# Patient Record
Sex: Female | Born: 1980
Health system: Southern US, Community
[De-identification: ages and names within clinical notes are randomized; demographics above are authoritative.]

## PROBLEM LIST (undated history)

## (undated) DIAGNOSIS — E785 Hyperlipidemia, unspecified: Secondary | ICD-10-CM

## (undated) DIAGNOSIS — R0602 Shortness of breath: Secondary | ICD-10-CM

## (undated) DIAGNOSIS — J069 Acute upper respiratory infection, unspecified: Secondary | ICD-10-CM

## (undated) HISTORY — PX: TONSILLECTOMY: SUR1361

## (undated) HISTORY — PX: ELBOW SURGERY: SHX618

---

## 2004-03-27 ENCOUNTER — Emergency Department (HOSPITAL_COMMUNITY): Admission: EM | Admit: 2004-03-27 | Discharge: 2004-03-27 | Payer: Self-pay | Admitting: *Deleted

## 2004-04-06 ENCOUNTER — Ambulatory Visit: Payer: Self-pay | Admitting: Internal Medicine

## 2004-06-23 ENCOUNTER — Ambulatory Visit: Payer: Self-pay | Admitting: Internal Medicine

## 2004-07-29 ENCOUNTER — Ambulatory Visit: Payer: Self-pay | Admitting: Internal Medicine

## 2006-01-26 ENCOUNTER — Emergency Department (HOSPITAL_COMMUNITY): Admission: EM | Admit: 2006-01-26 | Discharge: 2006-01-26 | Payer: Self-pay | Admitting: Emergency Medicine

## 2006-03-16 ENCOUNTER — Ambulatory Visit: Payer: Self-pay | Admitting: Family Medicine

## 2006-08-17 ENCOUNTER — Inpatient Hospital Stay (HOSPITAL_COMMUNITY): Admission: AD | Admit: 2006-08-17 | Discharge: 2006-08-22 | Payer: Self-pay | Admitting: Obstetrics and Gynecology

## 2006-08-17 ENCOUNTER — Ambulatory Visit: Payer: Self-pay | Admitting: Obstetrics & Gynecology

## 2006-08-20 ENCOUNTER — Encounter (INDEPENDENT_AMBULATORY_CARE_PROVIDER_SITE_OTHER): Payer: Self-pay | Admitting: Specialist

## 2006-08-23 ENCOUNTER — Ambulatory Visit: Payer: Self-pay | Admitting: Obstetrics and Gynecology

## 2006-08-23 ENCOUNTER — Inpatient Hospital Stay (HOSPITAL_COMMUNITY): Admission: AD | Admit: 2006-08-23 | Discharge: 2006-08-23 | Payer: Self-pay | Admitting: Obstetrics & Gynecology

## 2008-10-23 ENCOUNTER — Emergency Department (HOSPITAL_COMMUNITY): Admission: EM | Admit: 2008-10-23 | Discharge: 2008-10-23 | Payer: Self-pay | Admitting: Emergency Medicine

## 2008-10-29 ENCOUNTER — Ambulatory Visit: Payer: Self-pay | Admitting: Nurse Practitioner

## 2008-10-29 DIAGNOSIS — H8309 Labyrinthitis, unspecified ear: Secondary | ICD-10-CM | POA: Insufficient documentation

## 2008-10-29 LAB — CONVERTED CEMR LAB
BUN: 11 mg/dL (ref 6–23)
Basophils Relative: 1 % (ref 0–1)
CO2: 28 meq/L (ref 19–32)
Calcium: 9.4 mg/dL (ref 8.4–10.5)
Eosinophils Absolute: 0.3 10*3/uL (ref 0.0–0.7)
HCT: 42.4 % (ref 36.0–46.0)
Hemoglobin: 14 g/dL (ref 12.0–15.0)
Lymphs Abs: 2.8 10*3/uL (ref 0.7–4.0)
MCHC: 33 g/dL (ref 30.0–36.0)
Monocytes Relative: 8 % (ref 3–12)
Platelets: 319 10*3/uL (ref 150–400)
RBC: 4.44 M/uL (ref 3.87–5.11)

## 2008-10-30 ENCOUNTER — Encounter (INDEPENDENT_AMBULATORY_CARE_PROVIDER_SITE_OTHER): Payer: Self-pay | Admitting: Nurse Practitioner

## 2008-11-04 ENCOUNTER — Ambulatory Visit: Payer: Self-pay | Admitting: Nurse Practitioner

## 2008-11-05 LAB — CONVERTED CEMR LAB
HDL: 33 mg/dL — ABNORMAL LOW (ref >39)
Total CHOL/HDL Ratio: 5.4

## 2008-11-11 ENCOUNTER — Ambulatory Visit: Payer: Self-pay | Admitting: *Deleted

## 2008-11-20 ENCOUNTER — Ambulatory Visit: Payer: Self-pay | Admitting: Nurse Practitioner

## 2008-11-20 DIAGNOSIS — E7800 Pure hypercholesterolemia, unspecified: Secondary | ICD-10-CM

## 2008-11-20 LAB — CONVERTED CEMR LAB
Cholesterol, target level: 200 mg/dL
HDL goal, serum: 40 mg/dL

## 2009-01-01 ENCOUNTER — Encounter (INDEPENDENT_AMBULATORY_CARE_PROVIDER_SITE_OTHER): Payer: Self-pay | Admitting: Internal Medicine

## 2009-01-01 ENCOUNTER — Ambulatory Visit: Payer: Self-pay | Admitting: Nurse Practitioner

## 2009-01-01 LAB — CONVERTED CEMR LAB
Triglycerides: 132 mg/dL (ref <150)
VLDL: 26 mg/dL (ref 0–40)

## 2009-01-06 ENCOUNTER — Telehealth (INDEPENDENT_AMBULATORY_CARE_PROVIDER_SITE_OTHER): Payer: Self-pay | Admitting: Nurse Practitioner

## 2009-02-03 ENCOUNTER — Ambulatory Visit: Payer: Self-pay | Admitting: Family Medicine

## 2009-02-03 DIAGNOSIS — G44229 Chronic tension-type headache, not intractable: Secondary | ICD-10-CM | POA: Insufficient documentation

## 2009-02-03 DIAGNOSIS — G4700 Insomnia, unspecified: Secondary | ICD-10-CM | POA: Insufficient documentation

## 2009-02-09 ENCOUNTER — Emergency Department (HOSPITAL_COMMUNITY): Admission: EM | Admit: 2009-02-09 | Discharge: 2009-02-09 | Payer: Self-pay | Admitting: Emergency Medicine

## 2009-04-02 ENCOUNTER — Ambulatory Visit: Payer: Self-pay | Admitting: Nurse Practitioner

## 2009-04-02 DIAGNOSIS — H6690 Otitis media, unspecified, unspecified ear: Secondary | ICD-10-CM | POA: Insufficient documentation

## 2009-04-02 DIAGNOSIS — R51 Headache: Secondary | ICD-10-CM

## 2009-04-02 LAB — CONVERTED CEMR LAB
Cholesterol: 167 mg/dL (ref 0–200)
HDL: 44 mg/dL (ref >39)
LDL Cholesterol: 79 mg/dL (ref 0–99)
Triglycerides: 221 mg/dL — ABNORMAL HIGH (ref <150)
VLDL: 44 mg/dL — ABNORMAL HIGH (ref 0–40)

## 2009-04-07 ENCOUNTER — Encounter (INDEPENDENT_AMBULATORY_CARE_PROVIDER_SITE_OTHER): Payer: Self-pay | Admitting: Nurse Practitioner

## 2009-04-23 ENCOUNTER — Telehealth (INDEPENDENT_AMBULATORY_CARE_PROVIDER_SITE_OTHER): Payer: Self-pay | Admitting: *Deleted

## 2009-05-19 ENCOUNTER — Ambulatory Visit: Payer: Self-pay | Admitting: Nurse Practitioner

## 2009-05-19 DIAGNOSIS — G510 Bell's palsy: Secondary | ICD-10-CM

## 2009-05-21 ENCOUNTER — Ambulatory Visit (HOSPITAL_COMMUNITY): Admission: RE | Admit: 2009-05-21 | Discharge: 2009-05-21 | Payer: Self-pay | Admitting: Internal Medicine

## 2009-05-21 ENCOUNTER — Encounter (INDEPENDENT_AMBULATORY_CARE_PROVIDER_SITE_OTHER): Payer: Self-pay | Admitting: Nurse Practitioner

## 2009-05-24 ENCOUNTER — Telehealth (INDEPENDENT_AMBULATORY_CARE_PROVIDER_SITE_OTHER): Payer: Self-pay | Admitting: Nurse Practitioner

## 2009-05-25 ENCOUNTER — Telehealth (INDEPENDENT_AMBULATORY_CARE_PROVIDER_SITE_OTHER): Payer: Self-pay | Admitting: Nurse Practitioner

## 2009-06-02 ENCOUNTER — Ambulatory Visit: Payer: Self-pay | Admitting: Nurse Practitioner

## 2009-06-02 DIAGNOSIS — J309 Allergic rhinitis, unspecified: Secondary | ICD-10-CM | POA: Insufficient documentation

## 2009-06-28 ENCOUNTER — Emergency Department (HOSPITAL_COMMUNITY): Admission: EM | Admit: 2009-06-28 | Discharge: 2009-06-28 | Payer: Self-pay | Admitting: Family Medicine

## 2009-07-05 ENCOUNTER — Ambulatory Visit: Payer: Self-pay | Admitting: Nurse Practitioner

## 2009-07-05 LAB — CONVERTED CEMR LAB: LDL Cholesterol: 96 mg/dL (ref 0–99)

## 2009-07-06 ENCOUNTER — Encounter (INDEPENDENT_AMBULATORY_CARE_PROVIDER_SITE_OTHER): Payer: Self-pay | Admitting: Nurse Practitioner

## 2009-07-15 ENCOUNTER — Ambulatory Visit: Payer: Self-pay | Admitting: Nurse Practitioner

## 2009-07-15 DIAGNOSIS — F341 Dysthymic disorder: Secondary | ICD-10-CM | POA: Insufficient documentation

## 2009-07-16 ENCOUNTER — Encounter (INDEPENDENT_AMBULATORY_CARE_PROVIDER_SITE_OTHER): Payer: Self-pay | Admitting: Nurse Practitioner

## 2009-08-12 ENCOUNTER — Ambulatory Visit: Payer: Self-pay | Admitting: Nurse Practitioner

## 2009-08-12 DIAGNOSIS — H9209 Otalgia, unspecified ear: Secondary | ICD-10-CM | POA: Insufficient documentation

## 2009-10-25 ENCOUNTER — Emergency Department (HOSPITAL_COMMUNITY): Admission: EM | Admit: 2009-10-25 | Discharge: 2009-10-25 | Payer: Self-pay | Admitting: Emergency Medicine

## 2010-06-22 ENCOUNTER — Emergency Department (HOSPITAL_COMMUNITY)
Admission: EM | Admit: 2010-06-22 | Discharge: 2010-06-22 | Payer: Self-pay | Source: Home / Self Care | Admitting: Family Medicine

## 2010-07-19 ENCOUNTER — Emergency Department (HOSPITAL_COMMUNITY)
Admission: EM | Admit: 2010-07-19 | Discharge: 2010-07-19 | Disposition: A | Discharge status: Home / Self Care | Payer: Self-pay | Attending: *Deleted | Admitting: *Deleted

## 2010-07-19 DIAGNOSIS — M542 Cervicalgia: Secondary | ICD-10-CM | POA: Insufficient documentation

## 2010-07-19 DIAGNOSIS — IMO0001 Reserved for inherently not codable concepts without codable children: Secondary | ICD-10-CM | POA: Insufficient documentation

## 2010-07-19 NOTE — Assessment & Plan Note (Signed)
Summary: Anxiety   Vital Signs:  Patient profile:   30 year old female LMP:     06/2009 Weight:      154.7 pounds BMI:     25.44 BSA:     1.79 Temp:     97.8 degrees F oral Pulse rate:   71 / minute Pulse rhythm:   regular Resp:     16 per minute BP sitting:   110 / 74  (left arm) Cuff size:   regular  Vitals Entered By: Levon Hedger (July 15, 2009 10:13 AM) CC: pt is feeling anxious and she does not know what is happening...pt has been experiencing weakness in her body, Lipid Management, Depression Is Patient Diabetic? No Pain Assessment Patient in pain? no       Does patient need assistance? Functional Status Self care Ambulation Normal LMP (date): 06/2009     Enter LMP: 06/2009   CC:  pt is feeling anxious and she does not know what is happening...pt has been experiencing weakness in her body, Lipid Management, and Depression.  History of Present Illness:  Pt into the office with with c/o feeling anxious "Feeling of despiration" "I really can't explain it" Unable to recall when she has the feelings but reports that it has been daily for the past 15 days. Episodes can happen at home or out in the community. Pt never had these types of feelings in the past +palpiations +crying spells  Presents today with toddler child.  Social - reports that she has been thinking a lot about about her family.    Depression History:      The patient presents with symptoms of depression which have been present for greater than two weeks.  The patient is having a depressed mood most of the day and has a diminished interest in her usual daily activities.        The patient denies that she feels like life is not worth living, denies that she wishes that she were dead, and denies that she has thought about ending her life.         Depression Treatment History:  Prior Medication Used:   Start Date: Assessment of Effect:   Comments:  lexapro 10mg      07/15/2009     --        started  Lipid Management History:      Positive NCEP/ATP III risk factors include HDL cholesterol less than 40.  Negative NCEP/ATP III risk factors include female age less than 3 years old, non-diabetic, non-tobacco-user status, non-hypertensive, no ASHD (atherosclerotic heart disease), no prior stroke/TIA, no peripheral vascular disease, and no history of aortic aneurysm.        The patient states that she does not know about the "Therapeutic Lifestyle Change" diet.  Her compliance with the TLC diet is poor.  Comments: pt is taking cholesterol meds as ordered.    Allergies (verified): No Known Drug Allergies  Review of Systems General:  Denies loss of appetite and sleep disorder. CV:  Denies chest pain or discomfort. Resp:  Denies cough. GI:  Denies abdominal pain, nausea, and vomiting. Psych:  Complains of anxiety and depression.  Physical Exam  General:  alert.   Head:  normocephalic.   Eyes:  glasses Ears:  ear piercing(s) noted.   Neurologic:  alert & oriented X3.   Skin:  color normal.   Psych:  crying during the exam   Impression & Recommendations:  Problem # 1:  ANXIETY DEPRESSION (ICD-300.4)  reviewed with pt - handout given lexapro samples given  Orders: T-TSH ([id number])  Complete Medication List: 1)  Allegra-d 12 Hour 60-120 Mg Xr12h-tab (Fexofenadine-pseudoephedrine) .... One tablet by mouth two times a day as needed for allegies 2)  Meclizine Hcl 25 Mg Tabs (Meclizine hcl) .... One tablet by mouth daily as needed for dizziness 3)  Tricor 145 Mg Tabs (Fenofibrate) .... One tablet by mouth daily for cholesterol 4)  Naprosyn 500 Mg Tabs (Naproxen) .... One every 12 hours for headache 5)  Lexapro 10 Mg Tabs (Escitalopram oxalate) .... One tablet by mouth daily for mood  Lipid Assessment/Plan:      Based on NCEP/ATP III, the patient's risk factor category is "0-1 risk factors".  The patient's lipid goals are as follows: Total cholesterol goal is 200; LDL  cholesterol goal is 160; HDL cholesterol goal is 40; Triglyceride goal is 150.    Patient Instructions: 1)  Read the handout 2)  Start lexapro 10mg  by mouth nightly 3)  Follow up in 4 weeks or sooner if necessary for mood. 4)  Will discuss medications at that time. Prescriptions: LEXAPRO 10 MG TABS (ESCITALOPRAM OXALATE) One tablet by mouth daily for mood  #28 x 0   Entered and Authorized by:   Lehman Prom FNP   Signed by:   Lehman Prom FNP on 07/15/2009   Method used:   Samples Given   RxID:   [id number]   Appended Document: Anxiety PHQ-9 score = 18

## 2010-07-19 NOTE — Letter (Signed)
Summary: DEPRESSION SCREENING  DEPRESSION SCREENING   Imported By: Arta Bruce 08/09/2009 14:19:15  _____________________________________________________________________  External Attachment:    Type:   Image     Comment:   External Document

## 2010-07-19 NOTE — Letter (Signed)
Summary: Handout Printed  Printed Handout:  - Anxiety and Panic Attacks 

## 2010-07-19 NOTE — Letter (Signed)
Summary: *HSN Results Follow up  [facility]  [address] [place], [place] [address]   Phone: [phone]  Fax: [phone]      07/16/2009   DAKOTAH HEIMAN [address] [place], [place]  [address]   Dear  Ms. Gailen Shelter,                            ____S.Drinkard,FNP   ____D. Gore,FNP       ____B. McPherson,MD   ____V. Rankins,MD    ____E. Mulberry,MD    _X___N. Daphine Deutscher, FNP  ____D. Reche Dixon, MD    ____K. Philipp Deputy, MD    ____Other     This letter is to inform you that your recent test(s):  _______Pap Smear    ____X___Lab Test     _______X-ray    ___X____ is within acceptable limits  _______ requires a medication change  _______ requires a follow-up lab visit  _______ requires a follow-up visit with your provider   Comments: Labs done during recent office visit are normal.       _________________________________________________________ If you have any questions, please contact our office [phone].                    Sincerely,    Lehman Prom FNP [facility]

## 2010-07-19 NOTE — Letter (Signed)
Summary: Lipid Letter  [facility]  [address] [place], [place] [address]   Phone: [phone]  Fax: [phone]    07/06/2009  Peggy Monroe [address] [place], [place]  [address]  Dear Liana Gerold:  We have carefully reviewed your last lipid profile from 07/05/2009 and the results are noted below with a summary of recommendations for lipid management.    Cholesterol:       171     Goal: less than 200   HDL "good" Cholesterol:  37    Goal: greater than 40   LDL "bad" Cholesterol:   96     Goal: less than 130   Triglycerides:      188    Goal: less than 150   Your triglycerides are still slightly elevated but improved.  Continue to take Tricor 145mg  by mouth daily for cholesterol.  Also avoid fried, fatty foods.     Current Medications: 1)    Allegra-d 12 Hour 60-120 Mg Xr12h-tab (Fexofenadine-pseudoephedrine) .... One tablet by mouth two times a day as needed for allegies 2)    Meclizine Hcl 25 Mg Tabs (Meclizine hcl) .... One tablet by mouth daily as needed for dizziness 3)    Tricor 145 Mg Tabs (Fenofibrate) .... One tablet by mouth daily for cholesterol 4)    Naprosyn 500 Mg Tabs (Naproxen) .... One every 12 hours for headache  If you have any questions, please call. We appreciate being able to work with you.   Sincerely,    [facility] Lehman Prom FNP

## 2010-07-19 NOTE — Letter (Signed)
Summary: TEST ORDER FORM//CT WITHOUT CONTRAST/HEADACHE  TEST ORDER FORM//CT WITHOUT CONTRAST/HEADACHE   Imported By: Arta Bruce 07/09/2009 13:00:58  _____________________________________________________________________  External Attachment:    Type:   Image     Comment:   External Document

## 2010-07-19 NOTE — Assessment & Plan Note (Signed)
Summary: Depression/Anxiety   Vital Signs:  Patient profile:   30 year old female Weight:      150.6 pounds BMI:     24.77 BSA:     1.76 Temp:     97.7 degrees F oral Pulse rate:   73 / minute Pulse rhythm:   regular Resp:     16 per minute BP sitting:   99 / 67  (left arm) Cuff size:   regular  Vitals Entered By: Levon Hedger (August 12, 2009 10:04 AM) CC: follow-up visit, Lipid Management Pain Assessment Patient in pain? yes     Location: head  Does patient need assistance? Functional Status Self care Ambulation Normal   CC:  follow-up visit and Lipid Management.  History of Present Illness:  Pt into the office to f/u on mood. Dx with depression and started on lexapro (samples given) Currently more than depression she feels tension or stress. +dizziness +headache (unilateral - left) Unsure why she is under pressre All of a sudden she feels anxious, sometimes it lasts all day then sometimes it goes away in a few hours.  Lays down when she has this sudden feeling.   Pt does have a young child and brother helps to take care of him when symptoms occur.   Left side of body also feels weak. Pt into the office with a spanish interpreter  Ongoing problem with left ear and left side of body.  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  Negative NCEP/ATP III risk factors include female age less than 48 years old, non-diabetic, non-tobacco-user status, non-hypertensive, no ASHD (atherosclerotic heart disease), no prior stroke/TIA, no peripheral vascular disease, and no history of aortic aneurysm.        The patient states that she does not know about the "Therapeutic Lifestyle Change" diet.  Her compliance with the TLC diet is poor.  The patient does not know about adjunctive measures for cholesterol lowering.  She expresses no side effects from  her lipid-lowering medication.  Comments include: pt continues to take medications.  The patient denies any symptoms to suggest myopathy or liver disease.     Allergies (verified): No Known Drug Allergies  Review of Systems General:  Denies fever. Eyes:  Complains of blurring. ENT:  Complains of earache; left. CV:  Denies chest pain or discomfort. Resp:  Denies cough. GI:  Denies abdominal pain, nausea, and vomiting. Neuro:  Complains of headaches.  Physical Exam  General:  alert.   Head:  normocephalic.   Ears:  left ear with erythema (slight) clear fluid right ear normal Mouth:  fair dentition.   Lungs:  normal breath sounds.   Heart:  normal rate and regular rhythm.   Abdomen:  normal bowel sounds.   Neurologic:  alert & oriented X3.     Impression & Recommendations:  Problem # 1:  ANXIETY DEPRESSION (ICD-300.4) lexapro samples given pt to take vistaril as needed   Problem # 2:  ALLERGIC RHINITIS (ICD-477.9) pt to take allergy meds DAILY  Problem # 3:  EAR PAIN, LEFT (ICD-388.70) advised pt that she needs to take allergy meds use ear drops as needed  s/p course of antibiotics Her updated medication list for this problem includes:    Auralgan Soln (Antipyrine-benzocaine-polycos) .Marland KitchenMarland KitchenMarland KitchenMarland Kitchen 3 drops in left ear two times a day  Complete Medication List: 1)  Allegra-d 12 Hour 60-120 Mg Xr12h-tab (Fexofenadine-pseudoephedrine) .... One tablet by mouth two times a day as needed for allegies 2)  Tricor 145 Mg Tabs (Fenofibrate) .... One tablet by mouth daily for cholesterol 3)  Lexapro 10 Mg Tabs (Escitalopram oxalate) .... One tablet by mouth daily for mood 4)  Vistaril 25 Mg Caps (Hydroxyzine pamoate) .... One tablet by mouth daily as needed for anxiety 5)  Auralgan Soln (Antipyrine-benzocaine-polycos) .... 3 drops in left ear two times a day  Lipid Assessment/Plan:      Based on NCEP/ATP III, the patient's risk factor category is "0-1 risk factors".  The patient's lipid  goals are as follows: Total cholesterol goal is 200; LDL cholesterol goal is 160; HDL cholesterol goal is 40; Triglyceride goal is 150.    Patient Instructions: 1)  Continue to take lexapro nightly 2)  Take the vistaril as needed for anxiety 3)  Follow up as needed 4)  If ear pain continues may consider referring to a specialist Prescriptions: AURALGAN  SOLN (ANTIPYRINE-BENZOCAINE-POLYCOS) 3 drops in left ear two times a day  #91ml x 0   Entered and Authorized by:   Lehman Prom FNP   Signed by:   Lehman Prom FNP on 08/12/2009   Method used:   Print then Give to Patient   RxID:   [id number] VISTARIL 25 MG CAPS (HYDROXYZINE PAMOATE) One tablet by mouth daily as needed for anxiety  #30 x 0   Entered and Authorized by:   Lehman Prom FNP   Signed by:   Lehman Prom FNP on 08/12/2009   Method used:   Print then Give to Patient   RxID:   [id number] LEXAPRO 10 MG TABS (ESCITALOPRAM OXALATE) One tablet by mouth daily for mood  #30 x 5   Entered and Authorized by:   Lehman Prom FNP   Signed by:   Lehman Prom FNP on 08/12/2009   Method used:   Print then Give to Patient   RxID:   [id number]

## 2010-11-04 NOTE — Discharge Summary (Signed)
Peggy Monroe, Peggy Monroe    ACCOUNT NO.:  [id number]   MEDICAL RECORD NO.:  [id number]          PATIENT TYPE:  INP   LOCATION:  9121                          FACILITY:  [facility]   PHYSICIAN:  Phil D. Okey Dupre, M.D.     DATE OF BIRTH:  [birth year]   DATE OF ADMISSION:  08/17/2006  DATE OF DISCHARGE:  08/22/2006                               DISCHARGE SUMMARY   At admission, patient was a 30 year old G1, P0 at 40 weeks and 0 days by  last menstrual period with a complaint of leaking of fluid.  The patient  was found to have spontaneous rupture of membranes and was admitted for  labor augmentation.  At the time of admission, patient's vital signs  were as follows:  Temperature 98.5, pulse 78, and blood pressure 110/72.  Patient was positive for pooling positive Nitrazine and positive fern.  Patient's cervical exam revealed that she was 1 cm dilated.  Cervix was  found to be long.  Baby was at vertex presentation and -2 station.  Fetal heart rate was 140s with moderate variability and 15 x 15  accelerations.  Patient was contracting irregular.   Admission exam was within normal limits on all accounts.   Patient's preadmission medications included Zoloft for a history of  depression and prenatal vitamins.   Patient has no known drug allergies.   Patient's labor was augmented with Cervidil and Pitocin x2 days.  IV  antibiotics were begun at the time of admission for suspected rupture  for greater than 24 hours.   Patient's admission labs are as follows:  B+ blood type, antibody  negative, RPR nonreactive, Rubella immune, hepatitis B negative, GBS  negative, HIV negative.  Patient received several doses of Cervidil to  augment labor, and then proceeded to receive IV Pitocin to augment  labor.  On hospital day #3, patient became concerned secondary to  rupture for greater than 48 hours and cervical exam that had changed to  1-2 cm dilation at 50% and posterior; however, the baby  remained  reactive, and fetal tracings were reassuring throughout the labor  course.  At that time, the patient requested to have a primary low  transverse C-section and was counseled the risks and benefits by Dr.  Elsie Lincoln.   On hospital day #3, a primary low transverse C-section was performed in  a routine fashion with no complications.  Please see dictated operative  note for the details of that surgery.  The patient had a baby girl, and  hospital course remained uneventful after delivery.  On postoperative  day #1, the patient was found to be afebrile.  Vital signs were stable.  Exam within normal limits.  On hospital day #2 and hospital day #3  remained the same with normal labs, normal blood pressures, and remained  afebrile.   Patient was discharged on postoperative day #63 as a 30 year old G1, P1,  0-0-1, status post low transverse C-section on a female infant.  Patient  was breast and bottle-feeding and is to use condoms for contraception.  Pain was well controlled with pain medication, and patient was  requesting discharge.  At the time of  discharge, patient was given  prescriptions for ibuprofen 600 mg p.o. q.6h. p.r.n. pain, Percocet 1-2  tablets q.4-6h. p.r.n. pain, prenatal vitamins 1 tab p.o. daily, and  patient was instructed to follow up at the [facility] health department in  six weeks for a postpartum visit.  Patient's staples from incision were  removed on postoperative day #3, prior to leaving hospital.      Maylon Cos, C.N.M.    ______________________________  Javier Glazier. Okey Dupre, M.D.    SS/MEDQ  D:  10/24/2006  T:  10/24/2006  Job:  [id number]

## 2010-11-04 NOTE — Op Note (Signed)
NAME:  Peggy Monroe, Peggy Monroe    ACCOUNT NO.:  [id number]   MEDICAL RECORD NO.:  [id number]          PATIENT TYPE:  INP   LOCATION:  9121                          [facility]ILITY:  [facility]   PHYSICIAN:  Lesly Dukes, M.D. DATE OF BIRTH:  01-04-1981   DATE OF PROCEDURE:  08/19/2006  DATE OF DISCHARGE:                               OPERATIVE REPORT   PREOPERATIVE DIAGNOSIS:  The patient is a 30 year old para 0 female with  PROM x48 hours, thick meconium, failed labor induction, and the patient  requesting primary C-section.   POSTOPERATIVE DIAGNOSIS:  The patient is a 30 year old para 0 female  with PROM x48 hours, thick meconium, failed labor induction, and the  patient requesting primary C-section.   PROCEDURE:  Primary low transverse cesarean section.   SURGEON:  Lesly Dukes, M.D.   ASSISTANT:  Marc Morgans. Mayford Knife, M.D.   ANESTHESIA:  Spinal.   PATHOLOGY:  Placenta.   ESTIMATED BLOOD LOSS:  800.   COMPLICATIONS:  None.   FINDINGS:  Viable infant with Apgars of 9 and 9, vertex presentation,  thick meconium, grossly normal uterus, ovaries, and fallopian tubes.  Pediatric team at delivery.  __________ birth.  Details are in the  newborn sheet.   DESCRIPTION OF PROCEDURE:  After informed consent was obtained, the  patient was taken to the operating room where spinal anesthesia was  found to be adequate.  The patient was placed in the dorsal supine  position with a leftward tilt and prepared and draped in the normal  sterile fashion.  A Foley was in the bladder.   A Pfannenstiel skin incision was made with the scalpel and carried down  to the fascia.  The fascia was incised in the midline and extended  bilaterally with the Mayo scissors.  The superior and inferior aspects  of the fascial incision were grasped with Kocher clamps, tented up, and  dissected off sharply and bluntly from the underlying layer of the  rectus muscles.  The rectus muscles were separated in the  midline.  The  peritoneum was entered sharply and extended both superiorly and  inferiorly, with good visualization of the bladder.  The bladder blade  was inserted.  It was difficult to make a bladder flap, so the uterine  incision was made in a transverse fashion in the lower uterine segment  and extended bilaterally with the bandage scissors.  The baby's head  delivered atraumatically.  The nose and mouth were suctioned.  The rest  of the baby's body delivered without complication.  The cord was clamped  and cut, and the baby was handed off to the awaiting pediatricians.  A  cord gas was sent which was normal.  The placenta was delivered manually  and had a three-vessel cord.  The uterus was cleared of all clots and  debris.  The uterine incision was closed with 0 Vicryl in a running  locked fashion.  A second suture of 0 Vicryl was used to reinforce the  incision in an imbricated layer.  There was good hemostasis on the  uterus.  The abdominal cavity was copiously irrigated, and the uterus  was found to be  hemostatic without tension.  The rectus muscles and  peritoneum were noted to be hemostatic.  The fascia was closed with 0  Vicryl in a running fashion.  The subcutaneous tissue was copiously  irrigated and found to be hemostatic.  The skin was closed with staples.   The patient tolerated the procedure well.  Sponge, lap, instrument, and  needle counts were correct x2, and the patient was taken to recovery in  stable condition.           ______________________________  Lesly Dukes, M.D.     KHL/MEDQ  D:  08/19/2006  T:  08/19/2006  Job:  [id number]

## 2011-10-23 ENCOUNTER — Inpatient Hospital Stay (HOSPITAL_COMMUNITY): Payer: Medicaid Other

## 2011-10-23 ENCOUNTER — Inpatient Hospital Stay (HOSPITAL_COMMUNITY)
Admission: EM | Admit: 2011-10-23 | Discharge: 2011-10-25 | DRG: 690 | Disposition: A | Discharge status: Home / Self Care | Payer: Medicaid Other | Attending: Infectious Diseases | Admitting: Infectious Diseases

## 2011-10-23 ENCOUNTER — Encounter (HOSPITAL_COMMUNITY): Payer: Self-pay | Admitting: Emergency Medicine

## 2011-10-23 DIAGNOSIS — E785 Hyperlipidemia, unspecified: Secondary | ICD-10-CM | POA: Diagnosis present

## 2011-10-23 DIAGNOSIS — L270 Generalized skin eruption due to drugs and medicaments taken internally: Secondary | ICD-10-CM | POA: Diagnosis not present

## 2011-10-23 DIAGNOSIS — A498 Other bacterial infections of unspecified site: Secondary | ICD-10-CM | POA: Diagnosis present

## 2011-10-23 DIAGNOSIS — N12 Tubulo-interstitial nephritis, not specified as acute or chronic: Principal | ICD-10-CM | POA: Diagnosis present

## 2011-10-23 DIAGNOSIS — T3695XA Adverse effect of unspecified systemic antibiotic, initial encounter: Secondary | ICD-10-CM | POA: Diagnosis not present

## 2011-10-23 DIAGNOSIS — J45909 Unspecified asthma, uncomplicated: Secondary | ICD-10-CM | POA: Diagnosis present

## 2011-10-23 DIAGNOSIS — Z79899 Other long term (current) drug therapy: Secondary | ICD-10-CM

## 2011-10-23 HISTORY — DX: Shortness of breath: R06.02

## 2011-10-23 HISTORY — DX: Hyperlipidemia, unspecified: E78.5

## 2011-10-23 HISTORY — DX: Acute upper respiratory infection, unspecified: J06.9

## 2011-10-23 LAB — URINALYSIS, ROUTINE W REFLEX MICROSCOPIC
Bilirubin Urine: NEGATIVE
Glucose, UA: NEGATIVE mg/dL
Ketones, ur: NEGATIVE mg/dL
Nitrite: NEGATIVE
Urobilinogen, UA: 1 mg/dL (ref 0.0–1.0)
pH: 6.5 (ref 5.0–8.0)

## 2011-10-23 LAB — COMPREHENSIVE METABOLIC PANEL
Albumin: 3.6 g/dL (ref 3.5–5.2)
BUN: 10 mg/dL (ref 6–23)
CO2: 25 mEq/L (ref 19–32)
Calcium: 8.7 mg/dL (ref 8.4–10.5)
Creatinine, Ser: 0.6 mg/dL (ref 0.50–1.10)
GFR calc non Af Amer: >90 mL/min (ref >90)
Glucose, Bld: 100 mg/dL — ABNORMAL HIGH (ref 70–99)
Sodium: 137 mEq/L (ref 135–145)
Total Bilirubin: 0.6 mg/dL (ref 0.3–1.2)
Total Protein: 7.1 g/dL (ref 6.0–8.3)

## 2011-10-23 LAB — DIFFERENTIAL
Eosinophils Absolute: 0 10*3/uL (ref 0.0–0.7)
Lymphocytes Relative: 14 % (ref 12–46)
Neutro Abs: 10.6 10*3/uL — ABNORMAL HIGH (ref 1.7–7.7)
Neutrophils Relative %: 76 % (ref 43–77)

## 2011-10-23 LAB — CBC
HCT: 38.4 % (ref 36.0–46.0)
Hemoglobin: 12.6 g/dL (ref 12.0–15.0)
MCH: 31.4 pg (ref 26.0–34.0)
MCHC: 34.4 g/dL (ref 30.0–36.0)
MCHC: 33.8 g/dL (ref 30.0–36.0)
Platelets: 241 10*3/uL (ref 150–400)
RDW: 12.5 % (ref 11.5–15.5)
WBC: 14 10*3/uL — ABNORMAL HIGH (ref 4.0–10.5)

## 2011-10-23 LAB — URINE MICROSCOPIC-ADD ON

## 2011-10-23 LAB — RAPID URINE DRUG SCREEN, HOSP PERFORMED
Barbiturates: NOT DETECTED
Benzodiazepines: NOT DETECTED
Cocaine: NOT DETECTED
Tetrahydrocannabinol: NOT DETECTED

## 2011-10-23 LAB — CREATININE, SERUM
Creatinine, Ser: 0.52 mg/dL (ref 0.50–1.10)
GFR calc non Af Amer: >90 mL/min (ref >90)

## 2011-10-23 LAB — PREGNANCY, URINE: Preg Test, Ur: NEGATIVE

## 2011-10-23 MED ORDER — DEXTROSE 5 % IV SOLN
1.0000 g | Freq: Once | INTRAVENOUS | Status: AC
  Administered 2011-10-23: 1 g via INTRAVENOUS
  Filled 2011-10-23: qty 10

## 2011-10-23 MED ORDER — SODIUM CHLORIDE 0.9 % IV SOLN
INTRAVENOUS | Status: DC
  Administered 2011-10-23: 14:00:00 via INTRAVENOUS

## 2011-10-23 MED ORDER — MORPHINE SULFATE 2 MG/ML IJ SOLN
2.0000 mg | INTRAMUSCULAR | Status: DC | PRN
  Administered 2011-10-23 – 2011-10-24 (×5): 2 mg via INTRAVENOUS
  Filled 2011-10-23 (×5): qty 1

## 2011-10-23 MED ORDER — CEFTRIAXONE SODIUM 1 G IJ SOLR
1.0000 g | INTRAMUSCULAR | Status: DC
  Administered 2011-10-24: 1 g via INTRAVENOUS
  Filled 2011-10-23 (×2): qty 10

## 2011-10-23 MED ORDER — ACETAMINOPHEN 325 MG PO TABS
650.0000 mg | ORAL_TABLET | Freq: Four times a day (QID) | ORAL | Status: DC | PRN
  Administered 2011-10-23 – 2011-10-24 (×2): 650 mg via ORAL
  Filled 2011-10-23 (×2): qty 2

## 2011-10-23 MED ORDER — ENOXAPARIN SODIUM 40 MG/0.4ML ~~LOC~~ SOLN
40.0000 mg | Freq: Every day | SUBCUTANEOUS | Status: DC
  Administered 2011-10-23 – 2011-10-25 (×3): 40 mg via SUBCUTANEOUS
  Filled 2011-10-23 (×3): qty 0.4

## 2011-10-23 MED ORDER — ACETAMINOPHEN 650 MG RE SUPP: 650.0000 mg | Freq: Four times a day (QID) | RECTAL | Status: DC | PRN

## 2011-10-23 MED ORDER — SODIUM CHLORIDE 0.9 % IV SOLN
Freq: Once | INTRAVENOUS | Status: AC
  Administered 2011-10-23: 10 mL/h via INTRAVENOUS

## 2011-10-23 MED ORDER — ONDANSETRON HCL 4 MG/2ML IJ SOLN
4.0000 mg | Freq: Four times a day (QID) | INTRAMUSCULAR | Status: DC | PRN
  Administered 2011-10-23: 4 mg via INTRAVENOUS
  Filled 2011-10-23: qty 2

## 2011-10-23 NOTE — ED Notes (Signed)
Back pain x 3 days  Vomited today abd pain also

## 2011-10-23 NOTE — Progress Notes (Signed)
Pt arrived via stretcher from the ED. On arrival pt had an interpreter named Maxine Glenn that came up with her from downstairs. Pt is A/Ox4 and told me that she speaks some [place]. Pt lives home and has currently no complaints of pain. Pt skin is intact, will continue to monitor.

## 2011-10-23 NOTE — Progress Notes (Signed)
ANTIBIOTIC CONSULT NOTE - INITIAL  Pharmacy Consult for Rocephin Indication: Suspected pyelonephritis  No Known Allergies   Vital Signs: Temp: 100.9 F (38.3 C) (05/06 1121) Temp src: Oral (05/06 1121) BP: 155/74 mmHg (05/06 1121) Pulse Rate: 70  (05/06 1121)    Labs:  [facility] 10/23/11 0908  WBC 14.0*  HGB 13.2  PLT 237  LABCREA --  CREATININE 0.60   The CrCl is unknown because both a height and weight (above a minimum accepted value) are required for this calculation. No results found for this basename: VANCOTROUGH:2,VANCOPEAK:2,VANCORANDOM:2,GENTTROUGH:2,GENTPEAK:2,GENTRANDOM:2,TOBRATROUGH:2,TOBRAPEAK:2,TOBRARND:2,AMIKACINPEAK:2,AMIKACINTROU:2,AMIKACIN:2, in the last 72 hours   Microbiology: No results found for this or any previous visit (from the past 720 hour(s)).  Medical History: Past Medical History  Diagnosis Date  . Asthma   . HLD (hyperlipidemia)   . Shortness of breath     " with my asthma"  . Recurrent upper respiratory infection (URI)     Medications:  Prescriptions prior to admission  Medication Sig Dispense Refill  . albuterol (PROVENTIL HFA;VENTOLIN HFA) 108 (90 BASE) MCG/ACT inhaler Inhale 2 puffs into the lungs every 6 (six) hours as needed. Shortness of breath      . ibuprofen (ADVIL,MOTRIN) 200 MG tablet Take 400 mg by mouth every 6 (six) hours as needed. For pain       Assessment: Patient admitted with left back pain, increased urinary frequency, fever, chills and nausea+vomiting. Noted fever and WBC elevation. Patient received 1gm Rocephin ~10am today. Other baseline labs noted- UA positive for leukocytes, blood and urine cx pending.  Goal of Therapy:  Transition to PO antibiotic therapy in 1-2 days.  Plan:  1. Rocephin 1gm IV q 24h, next dose tomorrow AM. 2. Follow up culture results 3. Consider switching Lovenox to SQ heparin for VTE prophylaxis.  Thanks.  Mirna Mires K 10/23/2011,12:53 PM

## 2011-10-23 NOTE — ED Provider Notes (Signed)
History     CSN: [id number]  Arrival date & time 10/23/11  7829   First MD Initiated Contact with Patient 10/23/11 0840      Chief Complaint  Patient presents with  . Back Pain    (Consider location/radiation/quality/duration/timing/severity/associated sxs/prior treatment) HPI 31 yo female with who presents with 1 day history of back pain.  States she began having back pain yesterday that has been worsening.  Pain is located on the L side of her back with radiation to her L flank and abdomen.  She has felt febrile at home but has not checked her temperature.  She has also had severe nausea at home associated with this.  She does endorse urinary frequency without dysuria.  Reports her bowel movements have been normal, denies vaginal discharge.    Past Medical History  Diagnosis Date   HLD   . Asthma     No past surgical history on file.  No family history on file.  History  Substance Use Topics  . Smoking status: Never Smoker   . Smokeless tobacco: Not on file  . Alcohol Use: No    OB History    Grav Para Term Preterm Abortions TAB SAB Ect Mult Living                  Review of Systems  Constitutional: Positive for fever, chills and appetite change.  HENT: Negative for sore throat and neck stiffness.   Respiratory: Negative for chest tightness and shortness of breath.   Cardiovascular: Negative for chest pain.  Gastrointestinal: Positive for nausea and vomiting. Negative for diarrhea, constipation, blood in stool and abdominal distention.  Genitourinary: Positive for urgency, frequency and flank pain. Negative for dysuria, vaginal discharge, difficulty urinating and pelvic pain.  Musculoskeletal: Negative for myalgias.  Skin: Negative for rash.  Neurological: Positive for headaches. Negative for dizziness and light-headedness.    Allergies  Review of patient's allergies indicates no known allergies.  Home Medications   Current Outpatient Rx  Name Route Sig  Dispense Refill  . IBUPROFEN 200 MG PO TABS Oral Take 400 mg by mouth every 6 (six) hours as needed. For pain      BP 160/89  Pulse 103  Temp(Src) 100.8 F (38.2 C) (Oral)  Resp 20  SpO2 100%  Physical Exam  Constitutional: She is oriented to person, place, and time. She appears well-nourished.       In bed, appears uncomfortable   HENT:  Head: Normocephalic and atraumatic.  Neck: Neck supple. No thyromegaly present.  Cardiovascular: Regular rhythm and normal heart sounds.  Tachycardia present.   Pulmonary/Chest: Effort normal and breath sounds normal.  Abdominal: Soft. Normal appearance and bowel sounds are normal. She exhibits no distension. There is no tenderness. There is CVA tenderness (Left sided). There is no guarding, no tenderness at McBurney's point and negative Murphy's sign.  Lymphadenopathy:    She has no cervical adenopathy.  Neurological: She is alert and oriented to person, place, and time.  Skin: Skin is warm and dry. No rash noted.    ED Course  Procedures (including critical care time)  Labs Reviewed  COMPREHENSIVE METABOLIC PANEL - Abnormal; Notable for the following:    Glucose, Bld 100 (*)    ALT 38 (*)    All other components within normal limits  CBC - Abnormal; Notable for the following:    WBC 14.0 (*)    All other components within normal limits  DIFFERENTIAL - Abnormal; Notable for  the following:    Neutro Abs 10.6 (*)    Monocytes Absolute 1.4 (*)    All other components within normal limits  URINALYSIS, ROUTINE W REFLEX MICROSCOPIC - Abnormal; Notable for the following:    Hgb urine dipstick TRACE (*)    Leukocytes, UA MODERATE (*)    All other components within normal limits  URINE MICROSCOPIC-ADD ON - Abnormal; Notable for the following:    Bacteria, UA FEW (*)    All other components within normal limits  URINE CULTURE  CULTURE, BLOOD (ROUTINE X 2)  CULTURE, BLOOD (ROUTINE X 2)   No results found.   No diagnosis  found.    MDM  Exam concerning for pyelonephritis-checking UA, CBC, Chemistry.  Morphine for pain control.   9:10 AM  Labs and exam consistent with pyelo- rocephin 1g ordered, will admit to inpatient. IM teaching service accepting.         Everrett Coombe, DO 10/23/11 1019

## 2011-10-23 NOTE — H&P (Signed)
Hospital Admission Note Date: 10/23/2011  Patient name: Peggy Monroe Medical record number: [id number] Date of birth: 10-15-1980 Age: 31 y.o. Gender: female PCP: Lehman Prom, NP, NP  Medical Service: [facility]  Attending physician: Dr. Maurice March    1st Contact:    Dr. Manson Passey   Pager: [phone] 2nd Contact:  Dr. Allena Katz              Pager: [phone]  After 5 pm or weekends: 1st Contact:      Pager: [phone] 2nd Contact:      Pager: [phone]   Chief Complaint: Left back pain.   History of Present Illness: 31 yo female with no significant past medical history, presents to [facility] with 3 day history of left sided back pain, increased urinary frequency, nausea and vomiting x1 today and with fever and chills. Her pain radiates to her L flank and abdomen. She has also had severe nausea at home associated with this for the past few days and has has reduced PO intake. She denies dysuria. Reports her bowel movements have been normal, denies vaginal discharge. No other complaints today.    Home Meds: Tylenol PRN  Allergies: Allergies as of 10/23/2011  . (No Known Allergies)   Past Medical History  Diagnosis Date  . Asthma   . HLD (hyperlipidemia)    No past surgical history on file. No family history on file. History   Social History  . Marital Status: Married    Spouse Name: N/A    Number of Children: N/A  . Years of Education: N/A   Occupational History  . Housekeeper.    Social History Main Topics  . Smoking status: Never Smoker   . Smokeless tobacco: Not on file  . Alcohol Use: No  . Drug Use:   . Sexually Active:    Other Topics Concern  . Not on file   Social History Narrative  . Spanish speaking, but understands english, lives here in [place] with her brother.     Review of Systems: Negative except per HPI  Physical Exam: Blood pressure 160/89, pulse 103, temperature 100.8 F (38.2 C), temperature source Oral, resp. rate 20, SpO2 100.00%. General:   alert, well-developed, and cooperative to examination.   Head:  normocephalic and atraumatic.   Eyes:  vision grossly intact, pupils equal, pupils round, pupils reactive to light, no injection and anicteric.   Mouth:  pharynx pink and moist, no erythema, and no exudates.   Neck:  supple, full ROM, no thyromegaly, no JVD, and no carotid bruits.   Lungs:  normal respiratory effort, no accessory muscle use, normal breath sounds, no crackles, and no wheezes. Heart:  mildly tacycardic, regular rhythm, no murmur, no gallop, and no rub.   Abdomen:  soft, non-tender, normal bowel sounds, no distention, no guarding, no rebound tenderness, no hepatomegaly, and no splenomegaly.  Positive left CVA tenderness Msk:  no joint swelling, no joint warmth, and no redness over joints.   Pulses:  2+ DP/PT pulses bilaterally Extremities:  No cyanosis, clubbing, edema Neurologic:  alert & oriented X3, cranial nerves II-XII intact, strength normal in all extremities, sensation intact to light touch, and gait normal.   Skin:  turgor normal and no rashes.    Lab results: Basic Metabolic Panel:  Basename 10/23/11 0908  NA 137  K 4.1  CL 102  CO2 25  GLUCOSE 100*  BUN 10  CREATININE 0.60  CALCIUM 8.7  MG --  PHOS --   Liver Function Tests:  Basename 10/23/11 0908  AST 27  ALT 38*  ALKPHOS 73  BILITOT 0.6  PROT 7.1  ALBUMIN 3.6   CBC:  Basename 10/23/11 0908  WBC 14.0*  NEUTROABS 10.6*  HGB 13.2  HCT 38.4  MCV 93.9  PLT 237   Urinalysis:  Basename 10/23/11 0916  COLORURINE YELLOW  LABSPEC 1.014  PHURINE 6.5  GLUCOSEU NEGATIVE  HGBUR TRACE*  BILIRUBINUR NEGATIVE  KETONESUR NEGATIVE  PROTEINUR NEGATIVE  UROBILINOGEN 1.0  NITRITE NEGATIVE  LEUKOCYTESUR MODERATE*    Imaging results:  CT abd pending>>    Assessment & Plan by Problem:  Left Flank Pain - Patient's physical exam findings of left-sided CVA tenderness, history of nausea vomiting and fever along with urinalysis  positive for leukocytes makes that diagnosis polynephritis likely in this patient, nephrolithiasis is also in the differential, along with ectopic pregnancy. No neurological signs to suggest spinal involvement.  Plan: -Admits to inpatient for empiric treatment of pyelonephritis with Rocephin -Obtain blood cultures and urine cultures -Administer IV fluids at 75 cc an hour given reduced oral intake, and advance diet as tolerated -Provide pain relief with IV morphine, and nausea control with Zofran as needed -Obtain CT abdomen without contrast to rule out nephrolithiasis -Urine hCG to rule out ectopic pregnancy   Signed: Gustaf Mccarter 10/23/2011, 10:41 AM

## 2011-10-24 LAB — BASIC METABOLIC PANEL
BUN: 13 mg/dL (ref 6–23)
Calcium: 8.8 mg/dL (ref 8.4–10.5)
Creatinine, Ser: 0.62 mg/dL (ref 0.50–1.10)
GFR calc Af Amer: >90 mL/min (ref >90)
GFR calc non Af Amer: >90 mL/min (ref >90)
Glucose, Bld: 97 mg/dL (ref 70–99)
Potassium: 3.9 mEq/L (ref 3.5–5.1)

## 2011-10-24 LAB — CBC
Hemoglobin: 12.7 g/dL (ref 12.0–15.0)
MCH: 32.1 pg (ref 26.0–34.0)
MCHC: 34.1 g/dL (ref 30.0–36.0)
Platelets: 230 10*3/uL (ref 150–400)
RDW: 12.5 % (ref 11.5–15.5)

## 2011-10-24 LAB — LIPID PANEL
Cholesterol: 139 mg/dL (ref 0–200)
Triglycerides: 115 mg/dL (ref <150)
VLDL: 23 mg/dL (ref 0–40)

## 2011-10-24 NOTE — Progress Notes (Signed)
Subjective: The patient notes improved left flank pain. Afebrile overnight.  No vomiting.  Spoke with patient via interpreter today, and answered all questions.   Objective: Vital signs in last 24 hours: Filed Vitals:   10/23/11 2141 10/24/11 0558 10/24/11 1001 10/24/11 1400  BP: 112/72 103/59 100/62 100/53  Pulse: 74 63 82 88  Temp: 98.9 F (37.2 C) 98.5 F (36.9 C) 98.5 F (36.9 C) 98.6 F (37 C)  TempSrc:  Oral Oral Oral  Resp: 18 18 20 20   Weight: 156 lb 4.9 oz (70.9 kg)     SpO2: 100% 99% 99% 100%   Weight change:   Intake/Output Summary (Last 24 hours) at 10/24/11 1452 Last data filed at 10/24/11 1300  Gross per 24 hour  Intake 561.25 ml  Output    600 ml  Net -38.75 ml   PEX General: alert, cooperative, and in no apparent distress HEENT: pupils equal round and reactive to light, vision grossly intact, oropharynx clear and non-erythematous  Neck: supple, no lymphadenopathy Lungs: clear to ascultation bilaterally, normal work of respiration, no wheezes, rales, ronchi Heart: regular rate and rhythm, no murmurs, gallops, or rubs Abdomen: soft, minimally tender to LUQ and LLQ palpation, non-distended, normal bowel sounds   Back: mild L CVA tenderness Msk: no joint edema, warmth, or erythema Extremities: no cyanosis, clubbing, or edema Neurologic: alert & oriented X3, cranial nerves II-XII intact, strength grossly intact, sensation intact to light touch  Lab Results: Basic Metabolic Panel:  Lab 10/24/11 1610 10/23/11 1316 10/23/11 0908  NA 137 -- 137  K 3.9 -- 4.1  CL 103 -- 102  CO2 25 -- 25  GLUCOSE 97 -- 100*  BUN 13 -- 10  CREATININE 0.62 0.52 --  CALCIUM 8.8 -- 8.7  MG -- -- --  PHOS -- -- --   Liver Function Tests:  Lab 10/23/11 0908  AST 27  ALT 38*  ALKPHOS 73  BILITOT 0.6  PROT 7.1  ALBUMIN 3.6   CBC:  Lab 10/24/11 0540 10/23/11 1316 10/23/11 0908  WBC 12.0* 14.4* --  NEUTROABS -- -- 10.6*  HGB 12.7 12.6 --  HCT 37.2 37.3 --  MCV 93.9  93.0 --  PLT 230 241 --   Fasting Lipid Panel:  Lab 10/24/11 0540  CHOL 139  HDL 35*  LDLCALC 81  TRIG 960  CHOLHDL 4.0  LDLDIRECT --   Urine Drug Screen: Drugs of Abuse     Component Value Date/Time   LABOPIA NONE DETECTED 10/23/2011 1100   COCAINSCRNUR NONE DETECTED 10/23/2011 1100   LABBENZ NONE DETECTED 10/23/2011 1100   AMPHETMU NONE DETECTED 10/23/2011 1100   THCU NONE DETECTED 10/23/2011 1100   LABBARB NONE DETECTED 10/23/2011 1100    Urinalysis:  Lab 10/23/11 0916  COLORURINE YELLOW  LABSPEC 1.014  PHURINE 6.5  GLUCOSEU NEGATIVE  HGBUR TRACE*  BILIRUBINUR NEGATIVE  KETONESUR NEGATIVE  PROTEINUR NEGATIVE  UROBILINOGEN 1.0  NITRITE NEGATIVE  LEUKOCYTESUR MODERATE*   Micro Results: Recent Results (from the past 240 hour(s))  URINE CULTURE     Status: Normal (Preliminary result)   Collection Time   10/23/11  9:16 AM      Component Value Range Status Comment   Specimen Description URINE, RANDOM   Final    Special Requests ADD ON AT 1009   Final    Culture  Setup Time [id number]   Final    Colony Count >=100,000 COLONIES/ML   Final    Culture ESCHERICHIA COLI   Final  Report Status PENDING   Incomplete   CULTURE, BLOOD (ROUTINE X 2)     Status: Normal (Preliminary result)   Collection Time   10/23/11 10:10 AM      Component Value Range Status Comment   Specimen Description BLOOD LEFT HAND   Final    Special Requests BOTTLES DRAWN AEROBIC ONLY 8CC   Final    Culture  Setup Time [id number]   Final    Culture     Final    Value:        BLOOD CULTURE RECEIVED NO GROWTH TO DATE CULTURE WILL BE HELD FOR 5 DAYS BEFORE ISSUING A FINAL NEGATIVE REPORT   Report Status PENDING   Incomplete   CULTURE, BLOOD (ROUTINE X 2)     Status: Normal (Preliminary result)   Collection Time   10/23/11 10:17 AM      Component Value Range Status Comment   Specimen Description BLOOD RIGHT ARM   Final    Special Requests BOTTLES DRAWN AEROBIC AND ANAEROBIC 10CC   Final    Culture  Setup  Time [id number]   Final    Culture     Final    Value:        BLOOD CULTURE RECEIVED NO GROWTH TO DATE CULTURE WILL BE HELD FOR 5 DAYS BEFORE ISSUING A FINAL NEGATIVE REPORT   Report Status PENDING   Incomplete    Studies/Results: Ct Abdomen Pelvis Wo Contrast  10/23/2011  *RADIOLOGY REPORT*  Clinical Data: Left flank pain  CT ABDOMEN AND PELVIS WITHOUT CONTRAST  Technique:  Multidetector CT imaging of the abdomen and pelvis was performed following the standard protocol without intravenous contrast.  Comparison: None.  Findings: Lung bases are unremarkable.  Mild hepatic fatty infiltration.  Small accessory splenule is noted.  No calcified gallstones are noted within gallbladder.  No intrahepatic biliary ductal dilatation.  No aortic aneurysm.  The unenhanced pancreas, spleen and adrenal glands are unremarkable.  Unenhanced kidneys are symmetrical in size.  No nephrolithiasis.  No hydronephrosis or hydroureter.  No aortic aneurysm.  No small bowel obstruction.  No ascites or free air.  No adenopathy.  Moderate colonic stool.  There is no pericecal inflammation.  Normal appendix is clearly visualized axial image 61.  No calcified ureteral calculi are noted.  No calcified calculi are noted within urinary bladder.  Small amount of pelvic free fluid noted posterior pelvis.  The uterus is retroflexed.  Small amount of nonspecific fluid noted within uterus.  No pelvic free air is noted.  There is a probable cyst within the left ovary measures 1.9 cm.  Further evaluation with pelvic ultrasound could be performed.  IMPRESSION:  1.  No nephrolithiasis.  No hydronephrosis or hydroureter. 2.  No calcified ureteral calculi are noted. 3.  No pericecal inflammation.  Normal appendix is clearly visualized. 4.  There is  small amount of pelvic free fluid.  Retroflexed uterus.  Question cyst within left ovary measures 1.9 cm.  Further evaluation with pelvic ultrasound could be performed. 5.  Fatty infiltration of the liver.   Original Report Authenticated By: Natasha Mead, M.D.   US Transvaginal Non-ob  10/23/2011  *RADIOLOGY REPORT*  Clinical Data: Left ovarian mass on prior exam, abdominal pain  TRANSABDOMINAL AND TRANSVAGINAL ULTRASOUND OF PELVIS Technique:  Both transabdominal and transvaginal ultrasound examinations of the pelvis were performed. Transabdominal technique was performed for global imaging of the pelvis including uterus, ovaries, adnexal regions, and pelvic cul-de-sac.  Comparison: CT 10/23/2011  It was necessary to proceed with endovaginal exam following the transabdominal exam to visualize the uterus and ovaries, not seen transabdominally.  Findings:  Uterus: Retroverted, retroflexed.  5.6 x 5.1 x 5.0 cm.  Anterior mid body predominately intramural fibroid measures 1.2 x 0.9 x 0.9 cm.  Endometrium: 1 mm.  Uniformly thin and echogenic.  Right ovary:  2.4 x 1.6 x 1.3 cm.  Normal.  Left ovary: 2.1 x 4.3 x 1.9 cm.  Cumulus oophorus cyst incidentally noted.  Trace left adnexal and fluid noted in the cul-de-sac.  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  This is a physiologic finding and does not warrant specific further follow-up.  Original Report Authenticated By: Harrel Lemon, M.D.   US Pelvis Complete  10/23/2011  *RADIOLOGY REPORT*  Clinical Data: Left ovarian mass on prior exam, abdominal pain  TRANSABDOMINAL AND TRANSVAGINAL ULTRASOUND OF PELVIS Technique:  Both transabdominal and transvaginal ultrasound examinations of the pelvis were performed. Transabdominal technique was performed for global imaging of the pelvis including uterus, ovaries, adnexal regions, and pelvic cul-de-sac.  Comparison: CT 10/23/2011   It was necessary to proceed with endovaginal exam following the transabdominal exam to visualize the uterus and ovaries, not seen transabdominally.  Findings:  Uterus: Retroverted, retroflexed.  5.6 x 5.1 x 5.0 cm.  Anterior  mid body predominately intramural fibroid measures 1.2 x 0.9 x 0.9 cm.  Endometrium: 1 mm.  Uniformly thin and echogenic.  Right ovary:  2.4 x 1.6 x 1.3 cm.  Normal.  Left ovary: 2.1 x 4.3 x 1.9 cm.  Cumulus oophorus cyst incidentally noted.  Trace left adnexal and fluid noted in the cul-de-sac.  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  This is a physiologic finding and does not warrant specific further follow-up.  Original Report Authenticated By: Harrel Lemon, M.D.   Medications: I have reviewed the patient's current medications. Scheduled Meds:   . cefTRIAXone (ROCEPHIN)  IV  1 g Intravenous Q24H  . enoxaparin  40 mg Subcutaneous Daily   Continuous Infusions:   . sodium chloride 75 mL/hr at 10/24/11 0954   PRN Meds:.acetaminophen, acetaminophen, morphine injection, ondansetron (ZOFRAN) IV Assessment/Plan:  # Pyelonephritis - the patient presents with left flank pain, fevers, nausea, and vomiting, with UA results consistent with pyelonephritis -continue ceftriaxone -switch to PO antibiotics tomorrow, pending urine culture results -continue morphine, zofran for symptomatic relief  # Asthma - patient reports being diagnosed with asthma 6 months ago, currently asymptomatic -albuterol prn  # Prophy - lovenox  # Dispo - likely discharge home tomorrow   LOS: 1 day   Janalyn Harder 10/24/2011, 2:52 PM

## 2011-10-24 NOTE — Progress Notes (Signed)
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 Ms Peggy Monroe has classical clinical presentation of acute pyelonephritis and by far most common cause is E coli and since no recent hospital care the organism should be susceptible to ceftriaxone and agree with plans and management. As soon as she can keep fluids down she can be transitioned to oral regimen as outpatient and probably most simple regimen would be oral once daily quinolone for total of ten days. Not intravaginal ultrasound was reported to show a "cumulus oophrous cyst" as read by Dr. Christiana Pellant in radiology and in conversation with Dr. Neva Seat this is another way of saying mature egg follicle. Agree with plans and management. Lina Sayre

## 2011-10-25 LAB — URINE CULTURE: Colony Count: >=100000

## 2011-10-25 MED ORDER — CIPROFLOXACIN HCL 500 MG PO TABS: 500.0000 mg | ORAL_TABLET | Freq: Two times a day (BID) | ORAL | Status: AC

## 2011-10-25 MED ORDER — HYDROCODONE-ACETAMINOPHEN 5-325 MG PO TABS: 1.0000 | ORAL_TABLET | Freq: Four times a day (QID) | ORAL | Status: AC | PRN

## 2011-10-25 MED ORDER — CIPROFLOXACIN HCL 500 MG PO TABS
500.0000 mg | ORAL_TABLET | Freq: Two times a day (BID) | ORAL | Status: DC
  Administered 2011-10-25: 500 mg via ORAL
  Filled 2011-10-25 (×3): qty 1

## 2011-10-25 NOTE — Discharge Summary (Signed)
Internal Medicine Teaching [facility] Discharge Note  Name: Peggy Monroe MRN: [id number] DOB: [birth year] 31 y.o.  Date of Admission: 10/23/2011  8:18 AM Date of Discharge: 10/25/2011 Attending Physician: Lina Sayre, MD  Discharge Diagnosis: 1. Pyelonephritis - E coli, treated with ceftriaxone -> cipro 2. Asthma - asymptomatic, history unclear  Discharge Medications: Medication List  As of 10/26/2011  1:52 PM   TAKE these medications         albuterol 108 (90 BASE) MCG/ACT inhaler   Commonly known as: PROVENTIL HFA;VENTOLIN HFA   Inhale 2 puffs into the lungs every 6 (six) hours as needed. Shortness of breath      ciprofloxacin 500 MG tablet   Commonly known as: CIPRO   Take 1 tablet (500 mg total) by mouth 2 (two) times daily.      HYDROcodone-acetaminophen 5-325 MG per tablet   Commonly known as: NORCO   Take 1 tablet by mouth every 6 (six) hours as needed for pain.      ibuprofen 200 MG tablet   Commonly known as: ADVIL,MOTRIN   Take 400 mg by mouth every 6 (six) hours as needed. For pain            Disposition and follow-up:   Ms.Crystalee J Corey Harold was discharged from [facility] in stable and improved condition, with improvement in left flank pain and resolution of nausea and vomiting.  The patient will follow-up with her PCP, Dr. Cliffton Asters, on 10/30/2011, to ensure resolution of pyelonephritis and compliance with cipro.  Follow-up Appointments:  Follow-up Information    Follow up with Trula Ore, MD on 10/30/2011. (10:20 am)    Contact information:   [address], [place], [place] [phone]         Consultations: None  Procedures Performed:  Ct Abdomen Pelvis Wo Contrast  10/23/2011  *RADIOLOGY REPORT*  Clinical Data: Left flank pain  CT ABDOMEN AND PELVIS WITHOUT CONTRAST  Technique:  Multidetector CT imaging of the abdomen and pelvis was performed following the standard protocol without intravenous contrast.   Comparison: None.  Findings: Lung bases are unremarkable.  Mild hepatic fatty infiltration.  Small accessory splenule is noted.  No calcified gallstones are noted within gallbladder.  No intrahepatic biliary ductal dilatation.  No aortic aneurysm.  The unenhanced pancreas, spleen and adrenal glands are unremarkable.  Unenhanced kidneys are symmetrical in size.  No nephrolithiasis.  No hydronephrosis or hydroureter.  No aortic aneurysm.  No small bowel obstruction.  No ascites or free air.  No adenopathy.  Moderate colonic stool.  There is no pericecal inflammation.  Normal appendix is clearly visualized axial image 61.  No calcified ureteral calculi are noted.  No calcified calculi are noted within urinary bladder.  Small amount of pelvic free fluid noted posterior pelvis.  The uterus is retroflexed.  Small amount of nonspecific fluid noted within uterus.  No pelvic free air is noted.  There is a probable cyst within the left ovary measures 1.9 cm.  Further evaluation with pelvic ultrasound could be performed.  IMPRESSION:  1.  No nephrolithiasis.  No hydronephrosis or hydroureter. 2.  No calcified ureteral calculi are noted. 3.  No pericecal inflammation.  Normal appendix is clearly visualized. 4.  There is  small amount of pelvic free fluid.  Retroflexed uterus.  Question cyst within left ovary measures 1.9 cm.  Further evaluation with pelvic ultrasound could be performed. 5.  Fatty infiltration of the liver.  Original Report Authenticated By:  Lang Snow POP, M.D.   US Transvaginal Non-ob  10/23/2011  *RADIOLOGY REPORT*  Clinical Data: Left ovarian mass on prior exam, abdominal pain  TRANSABDOMINAL AND TRANSVAGINAL ULTRASOUND OF PELVIS Technique:  Both transabdominal and transvaginal ultrasound examinations of the pelvis were performed. Transabdominal technique was performed for global imaging of the pelvis including uterus, ovaries, adnexal regions, and pelvic cul-de-sac.  Comparison: CT 10/23/2011   It was  necessary to proceed with endovaginal exam following the transabdominal exam to visualize the uterus and ovaries, not seen transabdominally.  Findings:  Uterus: Retroverted, retroflexed.  5.6 x 5.1 x 5.0 cm.  Anterior mid body predominately intramural fibroid measures 1.2 x 0.9 x 0.9 cm.  Endometrium: 1 mm.  Uniformly thin and echogenic.  Right ovary:  2.4 x 1.6 x 1.3 cm.  Normal.  Left ovary: 2.1 x 4.3 x 1.9 cm.  Cumulus oophorus cyst incidentally noted.  Trace left adnexal and fluid noted in the cul-de-sac.  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  This is a physiologic finding and does not warrant specific further follow-up.  Original Report Authenticated By: Harrel Lemon, M.D.   US Pelvis Complete  10/23/2011  *RADIOLOGY REPORT*  Clinical Data: Left ovarian mass on prior exam, abdominal pain  TRANSABDOMINAL AND TRANSVAGINAL ULTRASOUND OF PELVIS Technique:  Both transabdominal and transvaginal ultrasound examinations of the pelvis were performed. Transabdominal technique was performed for global imaging of the pelvis including uterus, ovaries, adnexal regions, and pelvic cul-de-sac.  Comparison: CT 10/23/2011   It was necessary to proceed with endovaginal exam following the transabdominal exam to visualize the uterus and ovaries, not seen transabdominally.  Findings:  Uterus: Retroverted, retroflexed.  5.6 x 5.1 x 5.0 cm.  Anterior mid body predominately intramural fibroid measures 1.2 x 0.9 x 0.9 cm.  Endometrium: 1 mm.  Uniformly thin and echogenic.  Right ovary:  2.4 x 1.6 x 1.3 cm.  Normal.  Left ovary: 2.1 x 4.3 x 1.9 cm.  Cumulus oophorus cyst incidentally noted.  Trace left adnexal and fluid noted in the cul-de-sac.  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  This is a physiologic finding and does not warrant specific further follow-up.   Original Report Authenticated By: Harrel Lemon, M.D.    Admission HPI:  31 yo female with no significant past medical history, presents to [facility] with 3 day history of left sided back pain, increased urinary frequency, nausea and vomiting x1 today and with fever and chills. Her pain radiates to her L flank and abdomen. She has also had severe nausea at home associated with this for the past few days and has has reduced PO intake. She denies dysuria. Reports her bowel movements have been normal, denies vaginal discharge. No other complaints today.   Admission Physical Exam Blood pressure 160/89, pulse 103, temperature 100.8 F (38.2 C), temperature source Oral, resp. rate 20, SpO2 100.00%.  General: alert, well-developed, and cooperative to examination.  Head: normocephalic and atraumatic.  Eyes: vision grossly intact, pupils equal, pupils round, pupils reactive to light, no injection and anicteric.  Mouth: pharynx pink and moist, no erythema, and no exudates.  Neck: supple, full ROM, no thyromegaly, no JVD, and no carotid bruits.  Lungs: normal respiratory effort, no accessory muscle use, normal breath sounds, no crackles, and no wheezes.  Heart: mildly tacycardic, regular rhythm, no murmur, no gallop, and  no rub.  Abdomen: soft, non-tender, normal bowel sounds, no distention, no guarding, no rebound tenderness, no hepatomegaly, and no splenomegaly. Positive left CVA tenderness Msk: no joint swelling, no joint warmth, and no redness over joints.  Pulses: 2+ DP/PT pulses bilaterally Extremities: No cyanosis, clubbing, edema  Neurologic: alert & oriented X3, cranial nerves II-XII intact, strength normal in all extremities, sensation intact to light touch, and gait normal.  Skin: turgor normal and no rashes.   Admission Labs Basic Metabolic Panel:  [facility]  10/23/11 0908   NA  137   K  4.1   CL  102   CO2  25   GLUCOSE  100*   BUN  10   CREATININE  0.60   CALCIUM  8.7   MG  --     PHOS  --    Liver Function Tests:  [facility]  10/23/11 0908   AST  27   ALT  38*   ALKPHOS  73   BILITOT  0.6   PROT  7.1   ALBUMIN  3.6    CBC:  [facility]  10/23/11 0908   WBC  14.0*   NEUTROABS  10.6*   HGB  13.2   HCT  38.4   MCV  93.9   PLT  237    Urinalysis:  Basename  10/23/11 0916   COLORURINE  YELLOW   LABSPEC  1.014   PHURINE  6.5   GLUCOSEU  NEGATIVE   HGBUR  TRACE*   BILIRUBINUR  NEGATIVE   KETONESUR  NEGATIVE   PROTEINUR  NEGATIVE   UROBILINOGEN  1.0   NITRITE  NEGATIVE   LEUKOCYTESUR  MODERATE*    Urine Culture: E Coli >100K colonies, resistant to ampicillin, otherwise [facility] Course by problem list: 1. Pyelonephritis - The patient presented with a 3-day history of left flank pain, nausea, vomiting, and fever to 100.9.  Symptoms were consistent with pyelonephritis, and UA and follow-up urine culture showed E Coli as the causative organism (resistant to ampicillin, otherwise pan-sensitive).  The patient was treated with IV ceftriaxone x2 days, and discharged to complete 5 additional days of cipro.  CT abdomen showed a possible enlarged L ovary, initially concerning as an additional cause of the patient's symptoms, but follow-up US simply showed a physiologic oophorus cyst.    2. Asthma - the patient reports being diagnosed with asthma 6 months ago, and given an albuterol inhaler.  She reports no childhood asthma.  The history leading to this diagnosis is unclear, but the patient remained asymptomatic during hospitalization.  Time spent on discharge: 45 minutes  Discharge Vitals:  BP 93/55  Pulse 64  Temp(Src) 97.6 F (36.4 C) (Oral)  Resp 20  Wt 156 lb 4.9 oz (70.9 kg)  SpO2 100%  LMP 10/18/2011  Discharge Labs:  BMET    Component Value Date/Time   NA 137 10/24/2011 0540   K 3.9 10/24/2011 0540   CL 103 10/24/2011 0540   CO2 25 10/24/2011 0540   GLUCOSE 97 10/24/2011 0540   BUN 13 10/24/2011 0540   CREATININE 0.62 10/24/2011 0540   CALCIUM  8.8 10/24/2011 0540   GFRNONAA >90 10/24/2011 0540   GFRAA >90 10/24/2011 0540   CBC    Component Value Date/Time   WBC 12.0* 10/24/2011 0540   RBC 3.96 10/24/2011 0540   HGB 12.7 10/24/2011 0540   HCT 37.2 10/24/2011 0540   PLT 230 10/24/2011 0540   MCV 93.9 10/24/2011 0540   MCH  32.1 10/24/2011 0540   MCHC 34.1 10/24/2011 0540   RDW 12.5 10/24/2011 0540   LYMPHSABS 1.9 10/23/2011 0908   MONOABS 1.4* 10/23/2011 0908   EOSABS 0.0 10/23/2011 0908   BASOSABS 0.0 10/23/2011 0908    Signed: Janalyn Harder 10/25/2011, 8:42 AM

## 2011-10-25 NOTE — Progress Notes (Signed)
Subjective: Significant improvement in left flank pain today.  Patient desires to go home.  Patient notes mild rash on left hand, possibly representing antibiotic side effect.   Objective: Vital signs in last 24 hours: Filed Vitals:   10/24/11 1400 10/24/11 1800 10/24/11 1940 10/25/11 0536  BP: 100/53 109/68 103/64 93/55  Pulse: 88 81 83 64  Temp: 98.6 F (37 C) 98.5 F (36.9 C) 99.7 F (37.6 C) 97.6 F (36.4 C)  TempSrc: Oral Oral Oral Oral  Resp: 20 20 20 20   Weight:   156 lb 4.9 oz (70.9 kg)   SpO2: 100% 100% 100% 100%   Weight change: 0 lb (0 kg)  Intake/Output Summary (Last 24 hours) at 10/25/11 0836 Last data filed at 10/25/11 1191  Gross per 24 hour  Intake    240 ml  Output      0 ml  Net    240 ml   PEX General: alert, cooperative, and in no apparent distress HEENT: pupils equal round and reactive to light, vision grossly intact, oropharynx clear and non-erythematous  Neck: supple, no lymphadenopathy Lungs: clear to ascultation bilaterally, normal work of respiration, no wheezes, rales, ronchi Heart: regular rate and rhythm, no murmurs, gallops, or rubs Abdomen: soft, minimally tender to LUQ and LLQ palpation, non-distended, normal bowel sounds   Back: minimal L CVA tenderness Msk: no joint edema, warmth, or erythema Extremities: no cyanosis, clubbing, or edema Neurologic: alert & oriented X3, cranial nerves II-XII intact, strength grossly intact, sensation intact to light touch  Lab Results: Basic Metabolic Panel:  Lab 10/24/11 4782 10/23/11 1316 10/23/11 0908  NA 137 -- 137  K 3.9 -- 4.1  CL 103 -- 102  CO2 25 -- 25  GLUCOSE 97 -- 100*  BUN 13 -- 10  CREATININE 0.62 0.52 --  CALCIUM 8.8 -- 8.7  MG -- -- --  PHOS -- -- --   Liver Function Tests:  Lab 10/23/11 0908  AST 27  ALT 38*  ALKPHOS 73  BILITOT 0.6  PROT 7.1  ALBUMIN 3.6   CBC:  Lab 10/24/11 0540 10/23/11 1316 10/23/11 0908  WBC 12.0* 14.4* --  NEUTROABS -- -- 10.6*  HGB 12.7 12.6  --  HCT 37.2 37.3 --  MCV 93.9 93.0 --  PLT 230 241 --   Fasting Lipid Panel:  Lab 10/24/11 0540  CHOL 139  HDL 35*  LDLCALC 81  TRIG 956  CHOLHDL 4.0  LDLDIRECT --   Urine Drug Screen: Drugs of Abuse     Component Value Date/Time   LABOPIA NONE DETECTED 10/23/2011 1100   COCAINSCRNUR NONE DETECTED 10/23/2011 1100   LABBENZ NONE DETECTED 10/23/2011 1100   AMPHETMU NONE DETECTED 10/23/2011 1100   THCU NONE DETECTED 10/23/2011 1100   LABBARB NONE DETECTED 10/23/2011 1100    Urinalysis:  Lab 10/23/11 0916  COLORURINE YELLOW  LABSPEC 1.014  PHURINE 6.5  GLUCOSEU NEGATIVE  HGBUR TRACE*  BILIRUBINUR NEGATIVE  KETONESUR NEGATIVE  PROTEINUR NEGATIVE  UROBILINOGEN 1.0  NITRITE NEGATIVE  LEUKOCYTESUR MODERATE*   Micro Results: Recent Results (from the past 240 hour(s))  URINE CULTURE     Status: Normal   Collection Time   10/23/11  9:16 AM      Component Value Range Status Comment   Specimen Description URINE, RANDOM   Final    Special Requests ADD ON AT 1009   Final    Culture  Setup Time [id number]   Final    Colony Count >=100,000 COLONIES/ML  Final    Culture ESCHERICHIA COLI   Final    Report Status 10/25/2011 FINAL   Final    Organism ID, Bacteria ESCHERICHIA COLI   Final   CULTURE, BLOOD (ROUTINE X 2)     Status: Normal (Preliminary result)   Collection Time   10/23/11 10:10 AM      Component Value Range Status Comment   Specimen Description BLOOD LEFT HAND   Final    Special Requests BOTTLES DRAWN AEROBIC ONLY 8CC   Final    Culture  Setup Time [id number]   Final    Culture     Final    Value:        BLOOD CULTURE RECEIVED NO GROWTH TO DATE CULTURE WILL BE HELD FOR 5 DAYS BEFORE ISSUING A FINAL NEGATIVE REPORT   Report Status PENDING   Incomplete   CULTURE, BLOOD (ROUTINE X 2)     Status: Normal (Preliminary result)   Collection Time   10/23/11 10:17 AM      Component Value Range Status Comment   Specimen Description BLOOD RIGHT ARM   Final    Special Requests  BOTTLES DRAWN AEROBIC AND ANAEROBIC 10CC   Final    Culture  Setup Time [id number]   Final    Culture     Final    Value:        BLOOD CULTURE RECEIVED NO GROWTH TO DATE CULTURE WILL BE HELD FOR 5 DAYS BEFORE ISSUING A FINAL NEGATIVE REPORT   Report Status PENDING   Incomplete    Studies/Results: Ct Abdomen Pelvis Wo Contrast  10/23/2011  *RADIOLOGY REPORT*  Clinical Data: Left flank pain  CT ABDOMEN AND PELVIS WITHOUT CONTRAST  Technique:  Multidetector CT imaging of the abdomen and pelvis was performed following the standard protocol without intravenous contrast.  Comparison: None.  Findings: Lung bases are unremarkable.  Mild hepatic fatty infiltration.  Small accessory splenule is noted.  No calcified gallstones are noted within gallbladder.  No intrahepatic biliary ductal dilatation.  No aortic aneurysm.  The unenhanced pancreas, spleen and adrenal glands are unremarkable.  Unenhanced kidneys are symmetrical in size.  No nephrolithiasis.  No hydronephrosis or hydroureter.  No aortic aneurysm.  No small bowel obstruction.  No ascites or free air.  No adenopathy.  Moderate colonic stool.  There is no pericecal inflammation.  Normal appendix is clearly visualized axial image 61.  No calcified ureteral calculi are noted.  No calcified calculi are noted within urinary bladder.  Small amount of pelvic free fluid noted posterior pelvis.  The uterus is retroflexed.  Small amount of nonspecific fluid noted within uterus.  No pelvic free air is noted.  There is a probable cyst within the left ovary measures 1.9 cm.  Further evaluation with pelvic ultrasound could be performed.  IMPRESSION:  1.  No nephrolithiasis.  No hydronephrosis or hydroureter. 2.  No calcified ureteral calculi are noted. 3.  No pericecal inflammation.  Normal appendix is clearly visualized. 4.  There is  small amount of pelvic free fluid.  Retroflexed uterus.  Question cyst within left ovary measures 1.9 cm.  Further evaluation with pelvic  ultrasound could be performed. 5.  Fatty infiltration of the liver.  Original Report Authenticated By: Natasha Mead, M.D.   US Transvaginal Non-ob  10/23/2011  *RADIOLOGY REPORT*  Clinical Data: Left ovarian mass on prior exam, abdominal pain  TRANSABDOMINAL AND TRANSVAGINAL ULTRASOUND OF PELVIS Technique:  Both transabdominal and transvaginal ultrasound examinations of the pelvis were  performed. Transabdominal technique was performed for global imaging of the pelvis including uterus, ovaries, adnexal regions, and pelvic cul-de-sac.  Comparison: CT 10/23/2011   It was necessary to proceed with endovaginal exam following the transabdominal exam to visualize the uterus and ovaries, not seen transabdominally.  Findings:  Uterus: Retroverted, retroflexed.  5.6 x 5.1 x 5.0 cm.  Anterior mid body predominately intramural fibroid measures 1.2 x 0.9 x 0.9 cm.  Endometrium: 1 mm.  Uniformly thin and echogenic.  Right ovary:  2.4 x 1.6 x 1.3 cm.  Normal.  Left ovary: 2.1 x 4.3 x 1.9 cm.  Cumulus oophorus cyst incidentally noted.  Trace left adnexal and fluid noted in the cul-de-sac.  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  This is a physiologic finding and does not warrant specific further follow-up.  Original Report Authenticated By: Harrel Lemon, M.D.   US Pelvis Complete  10/23/2011  *RADIOLOGY REPORT*  Clinical Data: Left ovarian mass on prior exam, abdominal pain  TRANSABDOMINAL AND TRANSVAGINAL ULTRASOUND OF PELVIS Technique:  Both transabdominal and transvaginal ultrasound examinations of the pelvis were performed. Transabdominal technique was performed for global imaging of the pelvis including uterus, ovaries, adnexal regions, and pelvic cul-de-sac.  Comparison: CT 10/23/2011   It was necessary to proceed with endovaginal exam following the transabdominal exam to visualize the uterus and ovaries, not seen transabdominally.   Findings:  Uterus: Retroverted, retroflexed.  5.6 x 5.1 x 5.0 cm.  Anterior mid body predominately intramural fibroid measures 1.2 x 0.9 x 0.9 cm.  Endometrium: 1 mm.  Uniformly thin and echogenic.  Right ovary:  2.4 x 1.6 x 1.3 cm.  Normal.  Left ovary: 2.1 x 4.3 x 1.9 cm.  Cumulus oophorus cyst incidentally noted.  Trace left adnexal and fluid noted in the cul-de-sac.  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  This is a physiologic finding and does not warrant specific further follow-up.  Original Report Authenticated By: Harrel Lemon, M.D.   Medications: I have reviewed the patient's current medications. Scheduled Meds:    . enoxaparin  40 mg Subcutaneous Daily  . DISCONTD: cefTRIAXone (ROCEPHIN)  IV  1 g Intravenous Q24H   Continuous Infusions:    . DISCONTD: sodium chloride 75 mL/hr at 10/24/11 0954   PRN Meds:.acetaminophen, acetaminophen, morphine injection, ondansetron (ZOFRAN) IV Assessment/Plan:  # Pyelonephritis - the patient presents with left flank pain, fevers, nausea, and vomiting, with UA results consistent with pyelonephritis.  Possible rash due to ceftriaxone. -discontinue ceftriaxone -start ciprofloxacin -continue morphine, zofran for symptomatic relief  # Asthma - patient reports being diagnosed with asthma 6 months ago, currently asymptomatic -albuterol prn  # Prophy - lovenox  # Dispo - discharge home today   LOS: 2 days   Janalyn Harder 10/25/2011, 8:36 AM

## 2011-10-25 NOTE — ED Provider Notes (Signed)
I saw and evaluated the patient, reviewed the resident's note and I agree with the findings and plan.   .Face to face Exam:  General:  Awake HEENT:  Atraumatic Resp:  Normal effort Abd:  Nondistended Neuro:No focal weakness Lymph: No adenopathy   Nelia Shi, MD 10/25/11 [phone]

## 2011-10-25 NOTE — Discharge Instructions (Signed)
Por su pielonefritis, toma Ciprofloxacin 1 tab, dos veces cada dia (1 en la manana, 1 en la tarde), por 5 mas dias.  Usted puede toma Hydrocodone-acetaminophin por la dolor tambien.  Pielonefritis - Adultos  (Pyelonephritis, Adult)  La pielonefritis es una infeccin del rin. Hay dos tipos principales de pielonefritis:   Una infeccin que se inicia rpidamente sin sntomas previos (pielonefritis [place]).   Infecciones que persisten por un largo perodo (pielonefritis crnica).  CAUSAS  Hay dos causas principales:   Las bacterias viajan desde la vejiga al rin. Este problema aparece especialmente en mujeres embarazadas. La orina en la vejiga puede infectarse por diferentes causas, por ejemplo:   Inflamacin de la prstata (prostatitis).   Durante las [employer].   Infeccin en la vejiga (cistitis).   Las bacterias viajan desde el torrente sanguneo hacia el tejido del rin.  Las enfermedades que aumentan el riesgo son:   Diabetes.   Clculos renales o en la vescula.   Cncer.   Un catter colocado en la vejiga.   Otras anormalidades del rin o de Engineer, mining.  SNTOMAS   Dolor abdominal.   Dolor en la zona del costado o flanco.   Grant Ruts.   Escalofros.   Malestar estomacal.   Sangre en la orina Larose Kells).   Necesidad frecuente de orinar   Necesidad intensa o persistente de Geographical information systems officer.   Sensacin de ardor o pinchazos al [employer].  DIAGNSTICO  El mdico diagnosticar una infeccin en su rin basndose en los sntomas. Tambin tomar [place].  TRATAMIENTO  Generalmente el tratamiento depende de la gravedad de la infeccin.   Si la infeccin es leve y se diagnostica a tiempo, el mdico lo tratar con antibiticos por va oral y lo dejar irse a su casa.   Si la infeccin es ms grave, la bacteria podra haber ingresado al torrente sanguneo. Esto requerir antibiticos por va intravenosa y Administrator, arts en el hospital.  Los sntomas pueden incluir:   Fiebre alta.   Dolor intenso en un costado del cuerpo.   Escalofros   An despus de Financial risk analyst hospital, el mdico podr indicarle antibiticos por va oral durante cierto perodo de [place].   Podr prescribirle otros tratamientos segn la causa de la infeccin.  INSTRUCCIONES PARA EL CUIDADO EN EL HOGAR   Tome los antibiticos como se le indic. Tmelos todos, aunque se sienta mejor.   Concurra para Education officer, environmental un control y asegurarse de que la infeccin ha desaparecido.   Debe ingerir gran cantidad de lquido para mantener la orina de tono claro o color amarillo plido.   Tome medicamentos para la vejiga si siente urgencia para Geographical information systems officer o lo hace con mucha frecuencia.  SOLICITE ATENCIN MDICA DE INMEDIATO SI:   Tiene fiebre.   No puede tomar los antibiticos ni ingerir lquidos.   Comienza a sentir escalofros   Siente debilidad extrema o se desmaya.   No mejora despus de 2 das de [place].  ASEGRESE DE QUE:   Comprende estas instrucciones.   Controlar su enfermedad.   Solicitar ayuda de inmediato si no mejora o si empeora.  Document Released: 03/15/2005 Document Revised: 05/25/2011 [facility] Patient Information 2012 [place], [place].

## 2011-10-29 LAB — CULTURE, BLOOD (ROUTINE X 2)
Culture  Setup Time: 201305061503
Culture: NO GROWTH

## 2011-10-31 ENCOUNTER — Emergency Department (HOSPITAL_COMMUNITY)
Admission: EM | Admit: 2011-10-31 | Discharge: 2011-10-31 | Disposition: A | Discharge status: Home / Self Care | Payer: Self-pay | Attending: Emergency Medicine | Admitting: Emergency Medicine

## 2011-10-31 ENCOUNTER — Encounter (HOSPITAL_COMMUNITY): Payer: Self-pay | Admitting: *Deleted

## 2011-10-31 DIAGNOSIS — E785 Hyperlipidemia, unspecified: Secondary | ICD-10-CM | POA: Insufficient documentation

## 2011-10-31 DIAGNOSIS — J45909 Unspecified asthma, uncomplicated: Secondary | ICD-10-CM | POA: Insufficient documentation

## 2011-10-31 DIAGNOSIS — Z79899 Other long term (current) drug therapy: Secondary | ICD-10-CM | POA: Insufficient documentation

## 2011-10-31 DIAGNOSIS — R51 Headache: Secondary | ICD-10-CM | POA: Insufficient documentation

## 2011-10-31 MED ORDER — PROMETHAZINE HCL 25 MG PO TABS: 25.0000 mg | ORAL_TABLET | Freq: Four times a day (QID) | ORAL | Status: DC | PRN

## 2011-10-31 MED ORDER — OXYCODONE-ACETAMINOPHEN 5-325 MG PO TABS: 1.0000 | ORAL_TABLET | Freq: Four times a day (QID) | ORAL | Status: AC | PRN

## 2011-10-31 MED ORDER — KETOROLAC TROMETHAMINE 30 MG/ML IJ SOLN
30.0000 mg | Freq: Once | INTRAMUSCULAR | Status: AC
  Administered 2011-10-31: 30 mg via INTRAVENOUS
  Filled 2011-10-31: qty 1

## 2011-10-31 MED ORDER — SODIUM CHLORIDE 0.9 % IV BOLUS (SEPSIS)
1000.0000 mL | Freq: Once | INTRAVENOUS | Status: AC
  Administered 2011-10-31: 1000 mL via INTRAVENOUS

## 2011-10-31 MED ORDER — METOCLOPRAMIDE HCL 5 MG/ML IJ SOLN
10.0000 mg | Freq: Once | INTRAMUSCULAR | Status: AC
  Administered 2011-10-31: 10 mg via INTRAVENOUS
  Filled 2011-10-31: qty 2

## 2011-10-31 MED ORDER — DIPHENHYDRAMINE HCL 50 MG/ML IJ SOLN
25.0000 mg | Freq: Once | INTRAMUSCULAR | Status: AC
  Administered 2011-10-31: 25 mg via INTRAVENOUS
  Filled 2011-10-31: qty 1

## 2011-10-31 NOTE — ED Provider Notes (Signed)
History     CSN: [id number]  Arrival date & time 10/31/11  1124   First MD Initiated Contact with Patient 10/31/11 1143      Chief Complaint  Patient presents with  . Headache    (Consider location/radiation/quality/duration/timing/severity/associated sxs/prior treatment) HPI.... occipital headache for 3 days. No fever, chills, stiff neck, neurological deficits. Minimal nausea.  Advil makes it better. Nothing makes it worse. Severity is mild to moderate. No radiation. Symptoms are sharp  Past Medical History  Diagnosis Date  . Asthma   . HLD (hyperlipidemia)   . Shortness of breath     " with my asthma"  . Recurrent upper respiratory infection (URI)     Past Surgical History  Procedure Date  . Tonsillectomy   . Cesarean section     History reviewed. No pertinent family history.  History  Substance Use Topics  . Smoking status: Never Smoker   . Smokeless tobacco: Never Used  . Alcohol Use: No    OB History    Grav Para Term Preterm Abortions TAB SAB Ect Mult Living                  Review of Systems  All other systems reviewed and are negative.    Allergies  Review of patient's allergies indicates no known allergies.  Home Medications   Current Outpatient Rx  Name Route Sig Dispense Refill  . ALBUTEROL SULFATE HFA 108 (90 BASE) MCG/ACT IN AERS Inhalation Inhale 2 puffs into the lungs every 6 (six) hours as needed. Shortness of breath    . HYDROCODONE-ACETAMINOPHEN 5-325 MG PO TABS Oral Take 1 tablet by mouth every 6 (six) hours as needed for pain. 15 tablet 0  . IBUPROFEN 200 MG PO TABS Oral Take 400 mg by mouth every 6 (six) hours as needed. For pain    . OXYCODONE-ACETAMINOPHEN 5-325 MG PO TABS Oral Take 1-2 tablets by mouth every 6 (six) hours as needed for pain. 15 tablet 0  . PROMETHAZINE HCL 25 MG PO TABS Oral Take 1 tablet (25 mg total) by mouth every 6 (six) hours as needed for nausea. 10 tablet 0    BP 102/55  Pulse 71  Temp(Src) 97.6 F  (36.4 C) (Oral)  Resp 16  SpO2 100%  LMP 10/18/2011  Physical Exam  Nursing note and vitals reviewed. Constitutional: She is oriented to person, place, and time. She appears well-developed and well-nourished.  HENT:  Head: Normocephalic and atraumatic.  Eyes: Conjunctivae and EOM are normal. Pupils are equal, round, and reactive to light.  Neck: Normal range of motion. Neck supple.  Cardiovascular: Normal rate and regular rhythm.   Pulmonary/Chest: Effort normal and breath sounds normal.  Abdominal: Soft. Bowel sounds are normal.  Musculoskeletal: Normal range of motion.  Neurological: She is alert and oriented to person, place, and time.  Skin: Skin is warm and dry.  Psychiatric: She has a normal mood and affect.    ED Course  Procedures (including critical care time)  Labs Reviewed - No data to display No results found.   1. Headache       MDM  Patient has no neurological impairment. Rx IV Toradol, Benadryl, Reglan.  Recheck at 1515:  Feeling better. No neuro deficits.      Donnetta Hutching, MD 10/31/11 1524

## 2011-10-31 NOTE — ED Notes (Signed)
Pt medicated for pain as ordered. Will monitor 

## 2011-10-31 NOTE — ED Notes (Signed)
Pt in c/o headache x2 days, also nausea, denies vomiting, states she has history of headache but this feels different

## 2011-10-31 NOTE — ED Notes (Signed)
Discharge instruction was given to the patient with the help of a Spanish Interpreter

## 2011-10-31 NOTE — Discharge Instructions (Signed)
Increase fluids. medication for pain and nausea.

## 2011-10-31 NOTE — ED Notes (Signed)
Was seen and examined by Dr. Adriana Simas

## 2011-10-31 NOTE — ED Notes (Signed)
Pt presented to the ER with c/o headache x 3 days with dizziness and photophobia. Pt denies any fever, no nausea nor vomiting. Pt is NAD at present

## 2013-06-18 ENCOUNTER — Other Ambulatory Visit: Payer: Self-pay

## 2013-06-18 ENCOUNTER — Encounter (HOSPITAL_COMMUNITY): Payer: Self-pay | Admitting: Emergency Medicine

## 2013-06-18 ENCOUNTER — Emergency Department (HOSPITAL_COMMUNITY)
Admission: EM | Admit: 2013-06-18 | Discharge: 2013-06-18 | Disposition: A | Discharge status: Home / Self Care | Payer: Medicaid Other | Attending: Emergency Medicine | Admitting: Emergency Medicine

## 2013-06-18 ENCOUNTER — Emergency Department (HOSPITAL_COMMUNITY): Payer: Medicaid Other

## 2013-06-18 DIAGNOSIS — R51 Headache: Secondary | ICD-10-CM | POA: Insufficient documentation

## 2013-06-18 DIAGNOSIS — Z3202 Encounter for pregnancy test, result negative: Secondary | ICD-10-CM | POA: Insufficient documentation

## 2013-06-18 DIAGNOSIS — R071 Chest pain on breathing: Secondary | ICD-10-CM | POA: Insufficient documentation

## 2013-06-18 DIAGNOSIS — R0789 Other chest pain: Secondary | ICD-10-CM

## 2013-06-18 DIAGNOSIS — E785 Hyperlipidemia, unspecified: Secondary | ICD-10-CM | POA: Insufficient documentation

## 2013-06-18 DIAGNOSIS — J45909 Unspecified asthma, uncomplicated: Secondary | ICD-10-CM | POA: Insufficient documentation

## 2013-06-18 DIAGNOSIS — R55 Syncope and collapse: Secondary | ICD-10-CM | POA: Insufficient documentation

## 2013-06-18 DIAGNOSIS — Z79899 Other long term (current) drug therapy: Secondary | ICD-10-CM | POA: Insufficient documentation

## 2013-06-18 LAB — POCT I-STAT TROPONIN I

## 2013-06-18 LAB — BASIC METABOLIC PANEL
BUN: 13 mg/dL (ref 6–23)
Creatinine, Ser: 0.66 mg/dL (ref 0.50–1.10)
GFR calc non Af Amer: >90 mL/min (ref >90)
Glucose, Bld: 78 mg/dL (ref 70–99)
Potassium: 4 mEq/L (ref 3.7–5.3)

## 2013-06-18 LAB — CBC
HCT: 41.1 % (ref 36.0–46.0)
Hemoglobin: 14 g/dL (ref 12.0–15.0)
MCHC: 34.1 g/dL (ref 30.0–36.0)
MCV: 95.6 fL (ref 78.0–100.0)
RDW: 12.8 % (ref 11.5–15.5)

## 2013-06-18 LAB — POCT PREGNANCY, URINE: Preg Test, Ur: NEGATIVE

## 2013-06-18 MED ORDER — KETOROLAC TROMETHAMINE 30 MG/ML IJ SOLN
30.0000 mg | Freq: Once | INTRAMUSCULAR | Status: AC
  Administered 2013-06-18: 30 mg via INTRAVENOUS
  Filled 2013-06-18: qty 1

## 2013-06-18 MED ORDER — SODIUM CHLORIDE 0.9 % IV BOLUS (SEPSIS)
1000.0000 mL | Freq: Once | INTRAVENOUS | Status: AC
  Administered 2013-06-18: 1000 mL via INTRAVENOUS

## 2013-06-18 NOTE — ED Notes (Signed)
Used translator phones, having headache and left side chest pain that radiates into her left arm. Pt then reports having syncopal episode while at store and feeling sob. ekg done at triage, spo2 100%.

## 2013-06-18 NOTE — ED Provider Notes (Signed)
CSN: [id number]     Arrival date & time 06/18/13  1317 History   First MD Initiated Contact with Patient 06/18/13 1510     Chief Complaint  Patient presents with  . Chest Pain  . Headache  . Loss of Consciousness   HPI  32 y/o female with history as noted below who presents with cc of headache, dizziness and chest pain. She has had a headache and chest pain for the past week. The pain has been constant. She describes the pain as located in the center of her chest and the back of her head. The pain is described as "vibrations". She denies any modifying factors. Today she was at [employer]. She had a bowel movement in the bathroom and shortly afterwards felt like she was going to pass out. She states she went to the ground but never fully lost consciousness as she was able to still hear people speak. She has had headaches before. She last had a headache 1 month ago. The location and type of pain is similar but earlier today she states it was more intense.   Past Medical History  Diagnosis Date  . Asthma   . HLD (hyperlipidemia)   . Shortness of breath     " with my asthma"  . Recurrent upper respiratory infection (URI)    Past Surgical History  Procedure Laterality Date  . Tonsillectomy    . Cesarean section     History reviewed. No pertinent family history. History  Substance Use Topics  . Smoking status: Never Smoker   . Smokeless tobacco: Never Used  . Alcohol Use: No   OB History   Grav Para Term Preterm Abortions TAB SAB Ect Mult Living                 Review of Systems  Constitutional: Negative for fever and chills.  Eyes: Negative for visual disturbance.  Respiratory: Negative for cough and shortness of breath.   Cardiovascular: Positive for chest pain.  Gastrointestinal: Negative for nausea, vomiting and abdominal pain.  Genitourinary: Negative for dysuria and frequency.  Musculoskeletal: Negative for neck stiffness.  Neurological: Positive for syncope,  light-headedness and headaches. Negative for weakness and numbness.  All other systems reviewed and are negative.    Allergies  Review of patient's allergies indicates no known allergies.  Home Medications   Current Outpatient Rx  Name  Route  Sig  Dispense  Refill  . albuterol (PROVENTIL HFA;VENTOLIN HFA) 108 (90 BASE) MCG/ACT inhaler   Inhalation   Inhale 2 puffs into the lungs every 6 (six) hours as needed. Shortness of breath         . ibuprofen (ADVIL,MOTRIN) 200 MG tablet   Oral   Take 400 mg by mouth every 6 (six) hours as needed. For pain         . EXPIRED: promethazine (PHENERGAN) 25 MG tablet   Oral   Take 1 tablet (25 mg total) by mouth every 6 (six) hours as needed for nausea.   10 tablet   0    BP 91/59  Pulse 70  Temp(Src) 97.6 F (36.4 C) (Oral)  Resp 16  SpO2 100%  LMP 06/15/2013 Physical Exam  Nursing note and vitals reviewed. Constitutional: She is oriented to person, place, and time. She appears well-developed and well-nourished. No distress.  HENT:  Head: Normocephalic and atraumatic.  Eyes: Conjunctivae are normal. Pupils are equal, round, and reactive to light.  Fundoscopic exam:      The  right eye shows no hemorrhage and no papilledema.       The left eye shows no hemorrhage and no papilledema.  Neck: Normal range of motion. Neck supple.  Cardiovascular: Normal rate and regular rhythm.  Exam reveals no gallop and no friction rub.   No murmur heard. Pulmonary/Chest: Effort normal and breath sounds normal. She exhibits bony tenderness.    Abdominal: Soft. She exhibits no distension. There is no tenderness.  Musculoskeletal: Normal range of motion. She exhibits no edema and no tenderness.  Neurological: She is alert and oriented to person, place, and time. She has normal strength and normal reflexes. No cranial nerve deficit or sensory deficit. She displays a negative Romberg sign. Coordination and gait normal.  Skin: Skin is warm and dry.    Psychiatric: She has a normal mood and affect.    ED Course  Procedures (including critical care time) Labs Review Labs Reviewed  CBC  BASIC METABOLIC PANEL  PRO B NATRIURETIC PEPTIDE  POCT I-STAT TROPONIN I  POCT PREGNANCY, URINE   Imaging Review Dg Chest 2 View  06/18/2013   CLINICAL DATA:  Chest pain, headache, loss of consciousness.  EXAM: CHEST  2 VIEW  COMPARISON:  06/28/2009  FINDINGS: Small calcified granulomas in the left lung. Right lung is clear. Heart is normal size. No effusions. No acute bony abnormality.  IMPRESSION: No active cardiopulmonary disease.   Electronically Signed   By: Charlett Nose M.D.   On: 06/18/2013 14:35   EKG Interpretation   None      MDM   1. Chest wall pain   2. Syncope   3. Headache     32 y/o female with history as noted below who presents with cc of headache, dizziness and chest pain. Afebrile. VSS. Well appearing. Non-focal neuro exam. Syncope likely secondary to vasovagal response from bowel movement. HA and CP have no red flag features and have been present for the last week. Doubt posterior circulation compromise given normal FTN, normal gait, normal rapid alternating hand movements and visual fields. Doubt ACS as this patient is low risk, has a normal EKG, has negative troponin. Low risk for PE and meets PERC criteria. Doubt dissection. No evidence of pneumonia or pneumothorax. CP is likely MSK given reproducibility with palpation. At time of discharge HA and CP resolved. Follow up with pcp. Return precautions given and discussed with the patient who was in agreement with the plan.    Shanon Ace, MD 06/18/13 [phone]

## 2013-06-19 NOTE — ED Provider Notes (Signed)
I saw and evaluated the patient, reviewed the resident's note and I agree with the findings and plan.   .Face to face Exam:  General:  Awake HEENT:  Atraumatic Resp:  Normal effort Abd:  Nondistended Neuro:No focal weakness   EKG reviewed  Nelia Shiobert L Dudley Mages, MD 06/19/13 [phone]

## 2015-04-12 ENCOUNTER — Encounter (HOSPITAL_COMMUNITY): Payer: Self-pay | Admitting: Emergency Medicine

## 2015-04-12 ENCOUNTER — Emergency Department (HOSPITAL_COMMUNITY)
Admission: EM | Admit: 2015-04-12 | Discharge: 2015-04-12 | Disposition: A | Discharge status: Home / Self Care | Payer: Medicaid Other | Attending: Emergency Medicine | Admitting: Emergency Medicine

## 2015-04-12 DIAGNOSIS — Y92009 Unspecified place in unspecified non-institutional (private) residence as the place of occurrence of the external cause: Secondary | ICD-10-CM | POA: Insufficient documentation

## 2015-04-12 DIAGNOSIS — S3991XA Unspecified injury of abdomen, initial encounter: Secondary | ICD-10-CM | POA: Insufficient documentation

## 2015-04-12 DIAGNOSIS — S3992XA Unspecified injury of lower back, initial encounter: Secondary | ICD-10-CM

## 2015-04-12 DIAGNOSIS — S299XXA Unspecified injury of thorax, initial encounter: Secondary | ICD-10-CM | POA: Insufficient documentation

## 2015-04-12 DIAGNOSIS — J45909 Unspecified asthma, uncomplicated: Secondary | ICD-10-CM | POA: Insufficient documentation

## 2015-04-12 DIAGNOSIS — Z3202 Encounter for pregnancy test, result negative: Secondary | ICD-10-CM | POA: Insufficient documentation

## 2015-04-12 DIAGNOSIS — X58XXXA Exposure to other specified factors, initial encounter: Secondary | ICD-10-CM | POA: Insufficient documentation

## 2015-04-12 DIAGNOSIS — Y990 Civilian activity done for income or pay: Secondary | ICD-10-CM | POA: Insufficient documentation

## 2015-04-12 DIAGNOSIS — Y93G1 Activity, food preparation and clean up: Secondary | ICD-10-CM | POA: Insufficient documentation

## 2015-04-12 DIAGNOSIS — Z79899 Other long term (current) drug therapy: Secondary | ICD-10-CM | POA: Insufficient documentation

## 2015-04-12 DIAGNOSIS — M7918 Myalgia, other site: Secondary | ICD-10-CM

## 2015-04-12 DIAGNOSIS — Z8639 Personal history of other endocrine, nutritional and metabolic disease: Secondary | ICD-10-CM | POA: Insufficient documentation

## 2015-04-12 LAB — POC URINE PREG, ED: Preg Test, Ur: NEGATIVE

## 2015-04-12 LAB — URINALYSIS, ROUTINE W REFLEX MICROSCOPIC
Bilirubin Urine: NEGATIVE
GLUCOSE, UA: NEGATIVE mg/dL
HGB URINE DIPSTICK: NEGATIVE
Ketones, ur: NEGATIVE mg/dL
LEUKOCYTES UA: NEGATIVE
Nitrite: NEGATIVE
PH: 5.5 (ref 5.0–8.0)
Protein, ur: NEGATIVE mg/dL
Specific Gravity, Urine: 1.025 (ref 1.005–1.030)
Urobilinogen, UA: 1 mg/dL (ref 0.0–1.0)

## 2015-04-12 LAB — COMPREHENSIVE METABOLIC PANEL
ALBUMIN: 4.4 g/dL (ref 3.5–5.0)
ALK PHOS: 54 U/L (ref 38–126)
ALT: 29 U/L (ref 14–54)
AST: 20 U/L (ref 15–41)
Anion gap: 4 — ABNORMAL LOW (ref 5–15)
BUN: 17 mg/dL (ref 6–20)
CALCIUM: 8.9 mg/dL (ref 8.9–10.3)
CHLORIDE: 108 mmol/L (ref 101–111)
CO2: 26 mmol/L (ref 22–32)
CREATININE: 0.59 mg/dL (ref 0.44–1.00)
GFR calc non Af Amer: >60 mL/min (ref >60)
GLUCOSE: 90 mg/dL (ref 65–99)
Potassium: 4.9 mmol/L (ref 3.5–5.1)
SODIUM: 138 mmol/L (ref 135–145)
Total Bilirubin: 0.5 mg/dL (ref 0.3–1.2)
Total Protein: 7.7 g/dL (ref 6.5–8.1)

## 2015-04-12 LAB — CBC WITH DIFFERENTIAL/PLATELET
Basophils Absolute: 0 10*3/uL (ref 0.0–0.1)
Basophils Relative: 0 %
EOS ABS: 0.1 10*3/uL (ref 0.0–0.7)
EOS PCT: 1 %
HCT: 43.4 % (ref 36.0–46.0)
HEMOGLOBIN: 14.4 g/dL (ref 12.0–15.0)
LYMPHS ABS: 2.9 10*3/uL (ref 0.7–4.0)
LYMPHS PCT: 32 %
MCH: 32.1 pg (ref 26.0–34.0)
MCHC: 33.2 g/dL (ref 30.0–36.0)
MCV: 96.7 fL (ref 78.0–100.0)
MONOS PCT: 7 %
Monocytes Absolute: 0.6 10*3/uL (ref 0.1–1.0)
NEUTROS PCT: 60 %
Neutro Abs: 5.3 10*3/uL (ref 1.7–7.7)
Platelets: 314 10*3/uL (ref 150–400)
RBC: 4.49 MIL/uL (ref 3.87–5.11)
RDW: 12.8 % (ref 11.5–15.5)
WBC: 9 10*3/uL (ref 4.0–10.5)

## 2015-04-12 LAB — LIPASE, BLOOD: Lipase: 38 U/L (ref 11–51)

## 2015-04-12 NOTE — ED Notes (Signed)
Bed: [facility] Expected date:  Expected time:  Means of arrival:  Comments: 

## 2015-04-12 NOTE — ED Provider Notes (Signed)
Medical Screening Exam in Fast Track.  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  The pain is worse with movement.   She also c/o a "ball" in her LUQ abdomen with associated nausea for the past 3 weeks, urinary frequency, abnormal vaginal discharge, and no period for 2 months.  She has an IUD.  Has unprotected sex with a single known partner.  Did take a home pregnancy test last week that was negative.  Pt is tender in left back but also tender throughout left abdomen.  Exam somewhat limited secondary to exam being performed in fast track chair.  Pt to be moved to the main ED for further workup.  Suspect muscle strain causing back pain but will move to back for further investigation of abdominal symptoms.  Pt is stable for move to main ED.  CBC, CMP, lipase, UA, Upreg ordered.    Trixie Dredgemily Dierdre Mccalip, PA-C 04/12/15 1150  Mancel BaleElliott Wentz, MD 04/12/15 [phone]

## 2015-04-12 NOTE — ED Provider Notes (Signed)
CSN: [id number]     Arrival date & time 04/12/15  1055 History   First MD Initiated Contact with Patient 04/12/15 1132     Chief Complaint  Patient presents with  . Back Pain     (Consider location/radiation/quality/duration/timing/severity/associated sxs/prior Treatment) HPI   Peggy Monroe is a 34 y.o. female who presents for evaluation of pain in her left mid back, which occurred when standing up today. She was cleaning floors at the time. The pain has persisted. She has a secondary complaint of a sensation of a ball in her left upper abdomen which occurred 3 weeks ago, and resolved. She has had some nausea. She denies constipation, or bowel incontinence. There are no other known modifying factors.   Past Medical History  Diagnosis Date  . Asthma   . HLD (hyperlipidemia)   . Shortness of breath     " with my asthma"  . Recurrent upper respiratory infection (URI)    Past Surgical History  Procedure Laterality Date  . Tonsillectomy    . Cesarean section     No family history on file. Social History  Substance Use Topics  . Smoking status: Never Smoker   . Smokeless tobacco: Never Used  . Alcohol Use: No   OB History    No data available     Review of Systems  All other systems reviewed and are negative.     Allergies  Review of patient's allergies indicates no known allergies.  Home Medications   Prior to Admission medications   Medication Sig Start Date End Date Taking? Authorizing Provider  albuterol (PROVENTIL HFA;VENTOLIN HFA) 108 (90 BASE) MCG/ACT inhaler Inhale 2 puffs into the lungs every 6 (six) hours as needed. Shortness of breath   Yes Historical Provider, MD  ibuprofen (ADVIL,MOTRIN) 200 MG tablet Take 400 mg by mouth every 6 (six) hours as needed for moderate pain.    Yes Historical Provider, MD   BP 97/53 mmHg  Pulse 68  Temp(Src) 98 F (36.7 C) (Oral)  Resp 15  SpO2 98% Physical Exam  Constitutional: She is oriented to person,  place, and time. She appears well-developed and well-nourished.  HENT:  Head: Normocephalic and atraumatic.  Right Ear: External ear normal.  Left Ear: External ear normal.  Eyes: Conjunctivae and EOM are normal. Pupils are equal, round, and reactive to light.  Neck: Normal range of motion and phonation normal. Neck supple.  Cardiovascular: Normal rate, regular rhythm and normal heart sounds.   Pulmonary/Chest: Effort normal and breath sounds normal. She exhibits no bony tenderness.  Abdominal: Soft. There is tenderness (Left upper quadrant, mild).  Musculoskeletal: Normal range of motion.  Tender left mid back with normal range of motion. Negative straight leg raising bilaterally.  Neurological: She is alert and oriented to person, place, and time. No cranial nerve deficit or sensory deficit. She exhibits normal muscle tone. Coordination normal.  Skin: Skin is warm, dry and intact.  Psychiatric: She has a normal mood and affect. Her behavior is normal. Judgment and thought content normal.  Nursing note and vitals reviewed.   ED Course  Procedures (including critical care time)  Medications - No data to display  Patient Vitals for the past 24 hrs:  BP Temp Temp src Pulse Resp SpO2  04/12/15 1327 (!) 97/53 mmHg 98 F (36.7 C) Oral 68 15 98 %  04/12/15 1112 110/73 mmHg 97.7 F (36.5 C) Oral 86 16 100 %       Labs Review  Labs Reviewed  COMPREHENSIVE METABOLIC PANEL - Abnormal; Notable for the following:    Anion gap 4 (*)    All other components within normal limits  LIPASE, BLOOD  CBC WITH DIFFERENTIAL/PLATELET  URINALYSIS, ROUTINE W REFLEX MICROSCOPIC (NOT AT [facility])  POC URINE PREG, ED    Imaging Review No results found. I have personally reviewed and evaluated these images and lab results as part of my medical decision-making.   EKG Interpretation None      MDM   Final diagnoses:  Back injury, initial encounter  Musculoskeletal pain    Musculoskeletal pain  without evidence for serious injury. Doubt spinal myelopathy, UTI, serous bacterial infection or metabolic instability.   Nursing Notes Reviewed/ Care Coordinated Applicable Imaging Reviewed Interpretation of Laboratory Data incorporated into ED treatment  The patient appears reasonably screened and/or stabilized for discharge and I doubt any other medical condition or other [facility] requiring further screening, evaluation, or treatment in the ED at this time prior to discharge.  Plan: Home Medications- IBU; Home Treatments- Heat; return here if the recommended treatment, does not improve the symptoms; Recommended follow up- PCP prn     Mancel Bale, MD 04/12/15 1343

## 2015-04-12 NOTE — ED Notes (Signed)
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

## 2015-04-12 NOTE — Discharge Instructions (Signed)
Use heat on the sore area 3 or 4 times a day  Take ibuprofen 400 mg 3 times a day as needed for pain.     Dolor msculoesqueltico (Musculoskeletal Pain) El dolor musculoesqueltico se siente en huesos y msculos. El dolor puede ocurrir en cualquier parte del cuerpo. El profesional que lo asiste podr tratarlo sin Geologist, engineeringconocer la causa del dolor. Lo tratar Time Warneraunque las pruebas de laboratorio (sangre y [place]), las radiografas y otros estudios sean normales. La causa de estos dolores puede ser un virus.  CAUSAS Generalmente no existe una causa definida para este trastorno. Tambin el Citigroupmalestar puede deberse a la Senecaactividad excesiva. En la actividad excesiva se incluye el hacer ejercicios fsicos muy intensos cuando no se est en buena forma. El dolor de huesos tambin puede deberse a cambios climticos. Los huesos son sensibles a los cambios en la presin atmosfrica. INSTRUCCIONES PARA EL CUIDADO DOMICILIARIO  Para proteger su privacidad, no se entregarn los The Sherwin-Williamsresultados de las pruebas por telfono. Asegrese de conseguirlos. Consulte el modo en que podr obtenerlos si no se lo han informado. Es su responsabilidad contar con los Lubrizol Corporationresultados de las pruebas.  Utilice los medicamentos de venta libre o de prescripcin para Chief Technology Officerel dolor, Environmental health practitionerel malestar o la Porterdalefiebre, segn se lo indique el profesional que lo asiste. Si le han administrado medicamentos, no conduzca, no opere maquinarias ni Diplomatic Services operational officerherramientas elctricas, y tampoco firme documentos legales durante 24 horas. No beba alcohol. No tome pldoras para dormir ni otros medicamentos que Museum/gallery curatorpuedan interferir en el tratamiento.  Podr seguir con todas las actividades a menos que stas le ocasionen ms Merck & Codolor. Cuando el dolor disminuya, es importante que gradualmente reanude toda la rutina habitual. Retome las actividades comenzando lentamente. Aumente gradualmente la intensidad y la duracin de sus actividades o del ejercicio.  Durante los perodos de dolor intenso, el reposo  en cama puede ser beneficioso. Recustese o sintese en la posicin que le sea ms cmoda.  Coloque hielo sobre la zona afectada.  Ponga hielo en Lucile Shuttersuna bolsa.  Colquese una toalla entre la piel y la bolsa de hielo.  Aplique el hielo durante 10 a 20 minutos 3  4 veces por da.  Si el dolor empeora, o no desaparece puede ser Northeast Utilitiesnecesario repetir las pruebas o Education officer, environmentalrealizar nuevos exmenes. El profesional que lo asiste podr requerir investigar ms profundamente para Veterinary surgeonencontrar la causa posible. SOLICITE ATENCIN MDICA DE INMEDIATO SI:  Siente que el dolor empeora y no se alivia con los medicamentos.  Siente dolor en el pecho asociado a falta de aire, sudoracin, nuseas o vmitos.  El dolor se localiza en el abdomen.  Comienza a sentir nuevos sntomas que parecen ser diferentes o que lo preocupan. ASEGRESE DE QUE:   Comprende las instrucciones para el alta mdica.  Controlar su enfermedad.  Solicitar atencin mdica de inmediato segn las indicaciones.   Esta informacin no tiene Theme park managercomo fin reemplazar el consejo del mdico. Asegrese de hacerle al mdico cualquier pregunta que tenga.   Document Released: 03/15/2005 Document Revised: 08/28/2011 Elsevier Interactive Patient Education [employer]2016 Elsevier Inc.

## 2015-09-25 ENCOUNTER — Emergency Department (HOSPITAL_COMMUNITY): Payer: Self-pay

## 2015-09-25 ENCOUNTER — Other Ambulatory Visit: Payer: Self-pay

## 2015-09-25 ENCOUNTER — Encounter (HOSPITAL_COMMUNITY): Payer: Self-pay | Admitting: *Deleted

## 2015-09-25 DIAGNOSIS — J45909 Unspecified asthma, uncomplicated: Secondary | ICD-10-CM | POA: Insufficient documentation

## 2015-09-25 DIAGNOSIS — M25512 Pain in left shoulder: Secondary | ICD-10-CM | POA: Insufficient documentation

## 2015-09-25 DIAGNOSIS — Z3202 Encounter for pregnancy test, result negative: Secondary | ICD-10-CM | POA: Insufficient documentation

## 2015-09-25 DIAGNOSIS — R42 Dizziness and giddiness: Secondary | ICD-10-CM | POA: Insufficient documentation

## 2015-09-25 DIAGNOSIS — Z8639 Personal history of other endocrine, nutritional and metabolic disease: Secondary | ICD-10-CM | POA: Insufficient documentation

## 2015-09-25 DIAGNOSIS — G44219 Episodic tension-type headache, not intractable: Secondary | ICD-10-CM | POA: Insufficient documentation

## 2015-09-25 DIAGNOSIS — R079 Chest pain, unspecified: Secondary | ICD-10-CM | POA: Insufficient documentation

## 2015-09-25 DIAGNOSIS — R202 Paresthesia of skin: Secondary | ICD-10-CM | POA: Insufficient documentation

## 2015-09-25 DIAGNOSIS — Z79899 Other long term (current) drug therapy: Secondary | ICD-10-CM | POA: Insufficient documentation

## 2015-09-25 LAB — URINALYSIS, ROUTINE W REFLEX MICROSCOPIC
Bilirubin Urine: NEGATIVE
Glucose, UA: NEGATIVE mg/dL
Hgb urine dipstick: NEGATIVE
KETONES UR: NEGATIVE mg/dL
Leukocytes, UA: NEGATIVE
Nitrite: NEGATIVE
PROTEIN: NEGATIVE mg/dL
Specific Gravity, Urine: 1.008 (ref 1.005–1.030)
pH: 5.5 (ref 5.0–8.0)

## 2015-09-25 LAB — CBC
HEMATOCRIT: 41.2 % (ref 36.0–46.0)
HEMOGLOBIN: 13.7 g/dL (ref 12.0–15.0)
MCH: 31.5 pg (ref 26.0–34.0)
MCHC: 33.3 g/dL (ref 30.0–36.0)
MCV: 94.7 fL (ref 78.0–100.0)
Platelets: 298 10*3/uL (ref 150–400)
RBC: 4.35 MIL/uL (ref 3.87–5.11)
RDW: 12.6 % (ref 11.5–15.5)
WBC: 10.8 10*3/uL — ABNORMAL HIGH (ref 4.0–10.5)

## 2015-09-25 LAB — BASIC METABOLIC PANEL
ANION GAP: 9 (ref 5–15)
BUN: 14 mg/dL (ref 6–20)
CHLORIDE: 104 mmol/L (ref 101–111)
CO2: 24 mmol/L (ref 22–32)
CREATININE: 0.6 mg/dL (ref 0.44–1.00)
Calcium: 9.2 mg/dL (ref 8.9–10.3)
GFR calc non Af Amer: >60 mL/min (ref >60)
Glucose, Bld: 131 mg/dL — ABNORMAL HIGH (ref 65–99)
Potassium: 3.4 mmol/L — ABNORMAL LOW (ref 3.5–5.1)
Sodium: 137 mmol/L (ref 135–145)

## 2015-09-25 LAB — POC URINE PREG, ED: PREG TEST UR: NEGATIVE

## 2015-09-25 NOTE — ED Notes (Signed)
Patient presents with c/o left sided CP and headache.  States the headache has been going on for about 1 week and is getting worse everyday

## 2015-09-26 ENCOUNTER — Emergency Department (HOSPITAL_COMMUNITY)
Admission: EM | Admit: 2015-09-26 | Discharge: 2015-09-26 | Disposition: A | Discharge status: Home / Self Care | Payer: Self-pay | Attending: Emergency Medicine | Admitting: Emergency Medicine

## 2015-09-26 DIAGNOSIS — M25512 Pain in left shoulder: Secondary | ICD-10-CM

## 2015-09-26 DIAGNOSIS — G44219 Episodic tension-type headache, not intractable: Secondary | ICD-10-CM

## 2015-09-26 MED ORDER — NAPROXEN 500 MG PO TABS: 500.0000 mg | ORAL_TABLET | Freq: Two times a day (BID) | ORAL | Status: DC

## 2015-09-26 MED ORDER — CYCLOBENZAPRINE HCL 10 MG PO TABS: 10.0000 mg | ORAL_TABLET | Freq: Two times a day (BID) | ORAL | Status: DC | PRN

## 2015-09-26 NOTE — ED Notes (Signed)
Pt verbalized understanding of d/c instructions and has no further questions. Pt stable and NAD.  

## 2015-09-26 NOTE — Discharge Instructions (Signed)
Terapia con calor (Heat Therapy) La terapia con calor puede ayudar a aliviar articulaciones y msculos doloridos, lesionados, tensos y rgidos. El calor Mirantrelaja los msculos, lo cual puede ayudar a Engineer, materialsaliviar el dolor.  RIESGOS Y COMPLICACIONES Si tiene cualquiera de los [address] Streetsiguientes problemas, no utilice la terapia con calor a menos que su mdico lo haya autorizado:  Mala circulacin.  Heridas que se estn curando o piel con cicatrices en la zona a tratar.  Diabetes, enfermedades cardacas o hipertensin arterial.  Incapacidad de sentir (entumecimiento) la zona tratada.  Hinchazn inusual de la zona a tratar.  Infecciones activas.  Cogulos sanguneos.  Cncer.  Incapacidad de Marketing executivecomunicar dolor. Esto puede incluir nios pequeos y personas que tienen problemas con la funcin cerebral (demencia).  Embarazo. La terapia con calor solo se debe usar en lesiones viejas, preexistentes o de larga duracin (crnicas). No utilice la terapia con calor en lesiones nuevas a menos que el mdico se lo indique. CMO USAR LA TERAPIA CON CALOR Existen varios tipos distintos de terapia con calor, como:  Compresas hmedas calientes.  Bao de agua caliente.  Bolsa de agua caliente.  Almohadilla trmica.  Bolsa de gel caliente.  Vendaje caliente.  Almohadilla trmica. Utilice el mtodo de terapia con calor que le sugiera su mdico. Siga las indicaciones del mdico sobre cmo y cundo usar la terapia con Airline pilotcalor. RECOMENDACIONES GENERALES PARA LA TERAPIA CON CALOR  No duerma mientras [place] la terapia con calor. Utilice la terapia con calor solo mientras est despierto.  La piel puede volverse rosada mientras [place] la terapia con calor. No use la terapia con calor si la piel se pone roja.  No use la terapia con calor si siente un dolor nuevo.  Una temperatura muy alta o una exposicin prolongada al calor puede causar quemaduras. Sea cauto con la terapia de calor para evitar quemar la piel.  No use  la terapia con calor en zonas de la piel que ya estn irritadas, como con una erupcin o una quemadura de sol. SOLICITE ATENCIN MDICA SI:  Observa ampollas, enrojecimiento, hinchazn o adormecimiento.  Siente un dolor nuevo.  El dolor [place]peora. ASEGRESE DE QUE:  Comprende estas instrucciones.  Controlar su afeccin.  Recibir ayuda de inmediato si no mejora o si empeora.   Esta informacin no tiene Theme park managercomo fin reemplazar el consejo del mdico. Asegrese de hacerle al mdico cualquier pregunta que tenga.   Document Released: 08/28/2011 Document Revised: 06/26/2014 Elsevier Interactive Patient Education 2016 Elsevier Inc.  Cefalea tensional (Tension Headache) Una cefalea tensional es una sensacin de dolor o presin que suele manifestarse en la frente y los lados de la cabeza. Este es el tipo ms comn de dolor de Turk[place]istancabeza. El dolor puede ser sordo o puede sentirse que comprime (constrictivo). Generalmente, no se asocia con nuseas o vmitos y no empeora con la actividad fsica. Las cefaleas tensionales pueden durar de 30 minutos a [address]varios das. CAUSAS Se desconoce la causa exacta de esta afeccin. Suelen comenzar despus de una situacin de estrs, ansiedad o por depresin. Otros factores desencadenantes pueden ser los siguientes:  Alcohol.  Demasiada cafena o abstinencia de cafena.  Infecciones respiratorias, como resfriados, gripes o sinusitis.  Problemas dentales o apretar los dientes.  Fatiga.  Mantener la cabeza y el cuello en la misma posicin durante un perodo prolongado, por ejemplo, al usar la computadora.  Fumar. SNTOMAS Los sntomas de esta afeccin incluyen lo siguiente:  Sensacin de presin alrededor de la cabeza.  Dolor "sordo" en la cabeza.  Dolor que siente sobre la frente y los lados de la cabeza.  Dolor a la [employer] de la cabeza, del cuello y de los hombros. DIAGNSTICO Esta afeccin se puede diagnosticar en funcin de los  sntomas y de un examen fsico. Pueden hacerle estudios, como una tomografa computarizada o una resonancia magntica de la cabeza. Estos estudios se indican si los sntomas son graves o fuera de lo comn. TRATAMIENTO Esta afeccin puede tratarse con cambios en el estilo de vida y medicamentos que lo ayuden a Paramedic los sntomas. INSTRUCCIONES PARA EL CUIDADO EN EL HOGAR Control del [employer] medicamentos de venta libre y los recetados solamente como se lo haya indicado el mdico.  Cuando sienta dolor de cabeza acustese en un cuarto oscuro y tranquilo.  Si se lo indican, aplique hielo sobre la cabeza y la zona del cuello:  Ponga el hielo en una bolsa plstica.  Coloque una toalla entre la piel y la bolsa de hielo.  Coloque el hielo durante , 2 a 3veces por Futures trader.  Utilice una almohadilla trmica o tome una ducha con agua caliente para aplicar calor en la cabeza y la zona del cuello como se lo haya indicado el mdico. Comida y bebida  Mantenga un horario para las comidas.  Limite el consumo de bebidas alcohlicas.  Disminuya el consumo de cafena o deje de consumir cafena. Instrucciones generales  Concurra a todas las visitas de control como se lo haya indicado el mdico. Esto es importante.  Lleve un diario de los dolores de cabeza para Financial risk analyst qu factores pueden desencadenarlos. Por ejemplo, escriba los siguientes datos:  Lo que usted come y Estate agent.  Cunto tiempo duerme.  Algn cambio en su dieta o en los medicamentos.  Pruebe algunas tcnicas de relajacin, como los [place].  Limite el estrs.  Sintese derecho y [employer].  No consuma productos que contengan tabaco, incluidos cigarrillos, tabaco de Theatre manager o cigarrillos electrnicos. Si necesita ayuda para dejar de fumar, consulte al [employer].  Haga actividad fsica habitualmente como se lo haya indicado el mdico.  Duerma entre 7 y 9horas o la cantidad de horas que le haya  recomendado el mdico. SOLICITE ATENCIN MDICA SI:  Los medicamentos no Materials engineer los sntomas.  Tiene un dolor de cabeza que es diferente del dolor de cabeza habitual.  Tiene nuseas o vmitos.  Tiene fiebre. SOLICITE ATENCIN MDICA DE INMEDIATO SI:  El dolor se hace cada vez ms intenso.  Ha vomitado repetidas veces.  Presenta rigidez en el cuello.  Sufre prdida de la visin.  Tiene problemas para hablar.  Siente dolor en el ojo o en el odo.  Presenta debilidad muscular o prdida del control muscular.  Pierde el equilibrio o tiene problemas para Advertising account planner.  Sufre mareos o se desmaya.  Se siente confundido.   Esta informacin no tiene Theme park manager el consejo del mdico. Asegrese de hacerle al mdico cualquier pregunta que tenga.   Document Released: 03/15/2005 Document Revised: 02/24/2015 Elsevier Interactive Patient Education [employer]

## 2015-09-26 NOTE — ED Provider Notes (Signed)
CSN: [id number]     Arrival date & time 09/25/15  2209 History   First MD Initiated Contact with Patient 09/26/15 0040     Chief Complaint  Patient presents with  . Headache  . Chest Pain     (Consider location/radiation/quality/duration/timing/severity/associated sxs/prior Treatment) HPI Comments: Patient with a history of HLD presents with complaint of progressively worsening headache x 2 weeks, intermittent, better when she takes Tylenol but recurs after Tylenol wears off. No vomiting, fever, congestion, sore throat or cough. She states the pain radiates into the left shoulder and causes tingling into her left hand. No weakness. No swelling. No injury she is aware of. She had a similar headache 6 months ago and states her doctor told her it was "muscular". She experiences dizziness/lightheadedness when she stands but denies syncope or near syncope. No ear pain.   Patient is a [age] y.o. female presenting with headaches and chest pain. The history is provided by the patient. A language interpreter was used Multimedia programmer([employer] via phone).  Headache Pain location:  Occipital Quality:  Sharp Radiates to:  L shoulder Duration:  2 weeks Similar to prior headaches: yes   Associated symptoms: dizziness   Associated symptoms: no abdominal pain, no congestion, no cough, no fever, no photophobia, no sore throat, no vomiting and no weakness  Numbness: No numbness but she describes tingling of left hand.   Chest Pain Associated symptoms: dizziness and headache   Associated symptoms: no abdominal pain, no cough, no dysphagia, no fever, no shortness of breath, not vomiting and no weakness  Numbness: No numbness but she describes tingling of left hand.     Past Medical History  Diagnosis Date  . Asthma   . HLD (hyperlipidemia)   . Shortness of breath     " with my asthma"  . Recurrent upper respiratory infection (URI)    Past Surgical History  Procedure Laterality Date  . Tonsillectomy    .  Cesarean section     No family history on file. Social History  Substance Use Topics  . Smoking status: Never Smoker   . Smokeless tobacco: Never Used  . Alcohol Use: No   OB History    No data available     Review of Systems  Constitutional: Negative for fever.  HENT: Negative for congestion, sore throat and trouble swallowing.   Eyes: Negative for photophobia and visual disturbance.  Respiratory: Negative for cough and shortness of breath.   Cardiovascular: Positive for chest pain.  Gastrointestinal: Negative for vomiting and abdominal pain.  Musculoskeletal:       See HPI.  Neurological: Positive for dizziness, light-headedness and headaches. Negative for syncope and weakness. Numbness: No numbness but she describes tingling of left hand.      Allergies  Review of patient's allergies indicates no known allergies.  Home Medications   Prior to Admission medications   Medication Sig Start Date End Date Taking? Authorizing Provider  albuterol (PROVENTIL HFA;VENTOLIN HFA) 108 (90 BASE) MCG/ACT inhaler Inhale 2 puffs into the lungs every 6 (six) hours as needed. Shortness of breath    Historical Provider, MD  ibuprofen (ADVIL,MOTRIN) 200 MG tablet Take 400 mg by mouth every 6 (six) hours as needed for moderate pain.     Historical Provider, MD   BP 135/90 mmHg  Pulse 73  Temp(Src) 98.1 F (36.7 C) (Oral)  Resp 18  Wt 70.761 kg  SpO2 100% Physical Exam  Constitutional: She is oriented to person, place, and time. She  appears well-developed and well-nourished. No distress.  HENT:  Head: Normocephalic and atraumatic.    Eyes: Conjunctivae are normal. Pupils are equal, round, and reactive to light.  Neck: Normal range of motion. Neck supple.  Cardiovascular: Normal rate.   Pulmonary/Chest: Effort normal.  Abdominal: Soft. Bowel sounds are normal. There is no tenderness. There is no rebound and no guarding.  Musculoskeletal: Normal range of motion. She exhibits no edema.        Arms: Neurological: She is alert and oriented to person, place, and time. She has normal strength and normal reflexes. No sensory deficit. She displays a negative Romberg sign. Coordination normal.  CN's 3-12 intact. Speech clear and focused. No deficits of coordination. Ambulatory without imbalance.   Skin: Skin is warm and dry. No rash noted.  Psychiatric: She has a normal mood and affect.    ED Course  Procedures (including critical care time) Labs Review Labs Reviewed  BASIC METABOLIC PANEL - Abnormal; Notable for the following:    Potassium 3.4 (*)    Glucose, Bld 131 (*)    All other components within normal limits  CBC - Abnormal; Notable for the following:    WBC 10.8 (*)    All other components within normal limits  URINALYSIS, ROUTINE W REFLEX MICROSCOPIC (NOT AT [facility])  Rosezena Sensor, ED  POC URINE PREG, ED    Imaging Review Dg Chest 2 View  09/25/2015  CLINICAL DATA:  Left-sided chest pain EXAM: CHEST  2 VIEW COMPARISON:  06/18/2013 FINDINGS: The heart size appears normal. There is no pleural effusion or edema. Calcified granuloma identified in the left midlung. IMPRESSION: 1. No acute cardiopulmonary abnormalities. Electronically Signed   By: Signa Kell M.D.   On: 09/25/2015 23:09   I have personally reviewed and evaluated these images and lab results as part of my medical decision-making.   EKG Interpretation None      MDM   Final diagnoses:  None    1. Tension type headache 2. Shoulder pain  Suspect the headache is muscular as it is reproducible. Cannot rule out radicular pain. There are no neurologic deficits on exam. She is a reliable historian and neurologically intact. VSS. Will treat with anti-inflammatories and muscle relaxants. Will refer to Orthopedics for further outpatient evaluation if symptoms persist.     Elpidio Anis, PA-C 09/26/15 0113  Gilda Crease, MD 09/26/15 0120

## 2017-04-24 IMAGING — DX DG CHEST 2V
2 series · 2 of 2 positions shown · non-contrast
Comparison: 06/18/2013

CLINICAL DATA: Left-sided chest pain

EXAM:
CHEST  2 VIEW

[w chest pa]
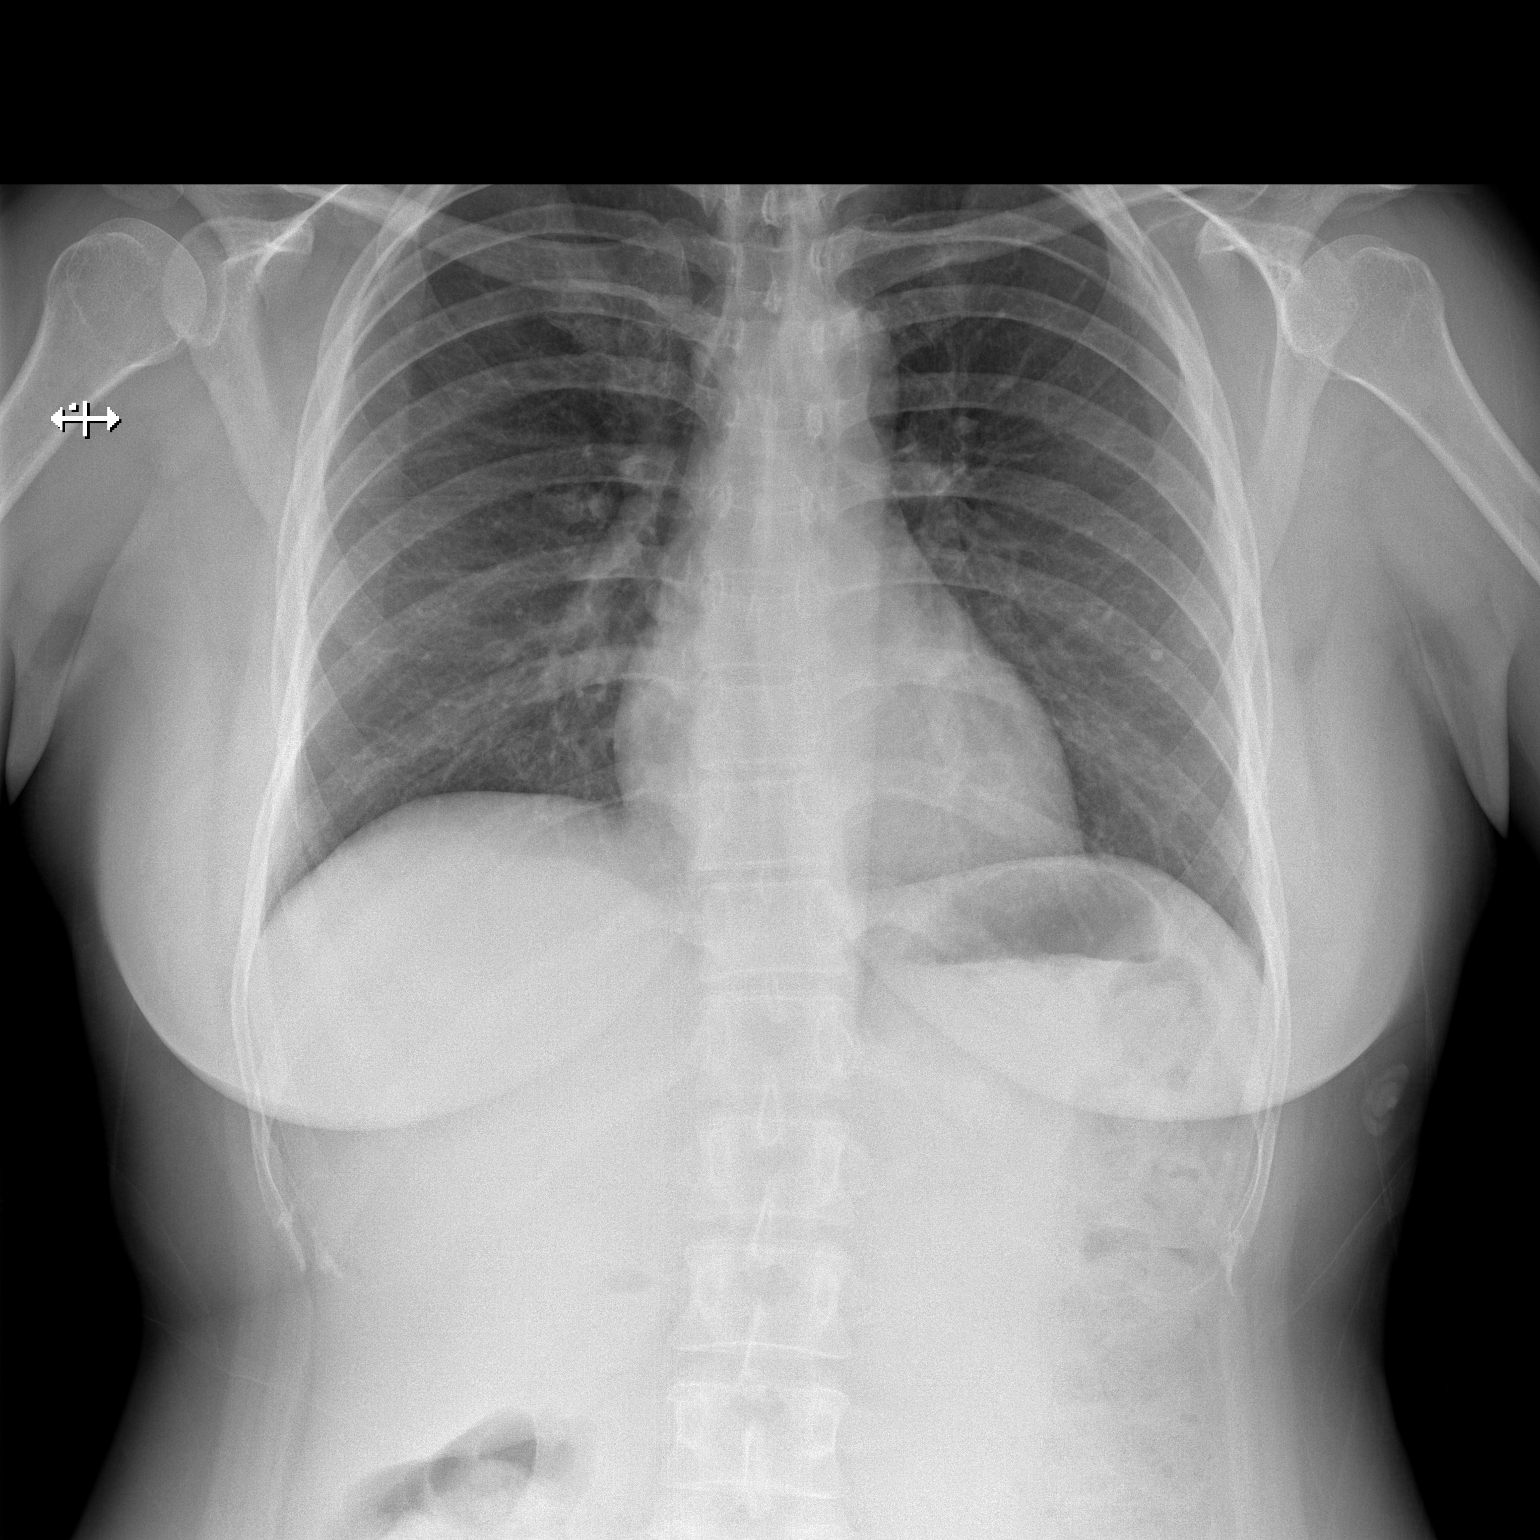

[w chest lat]
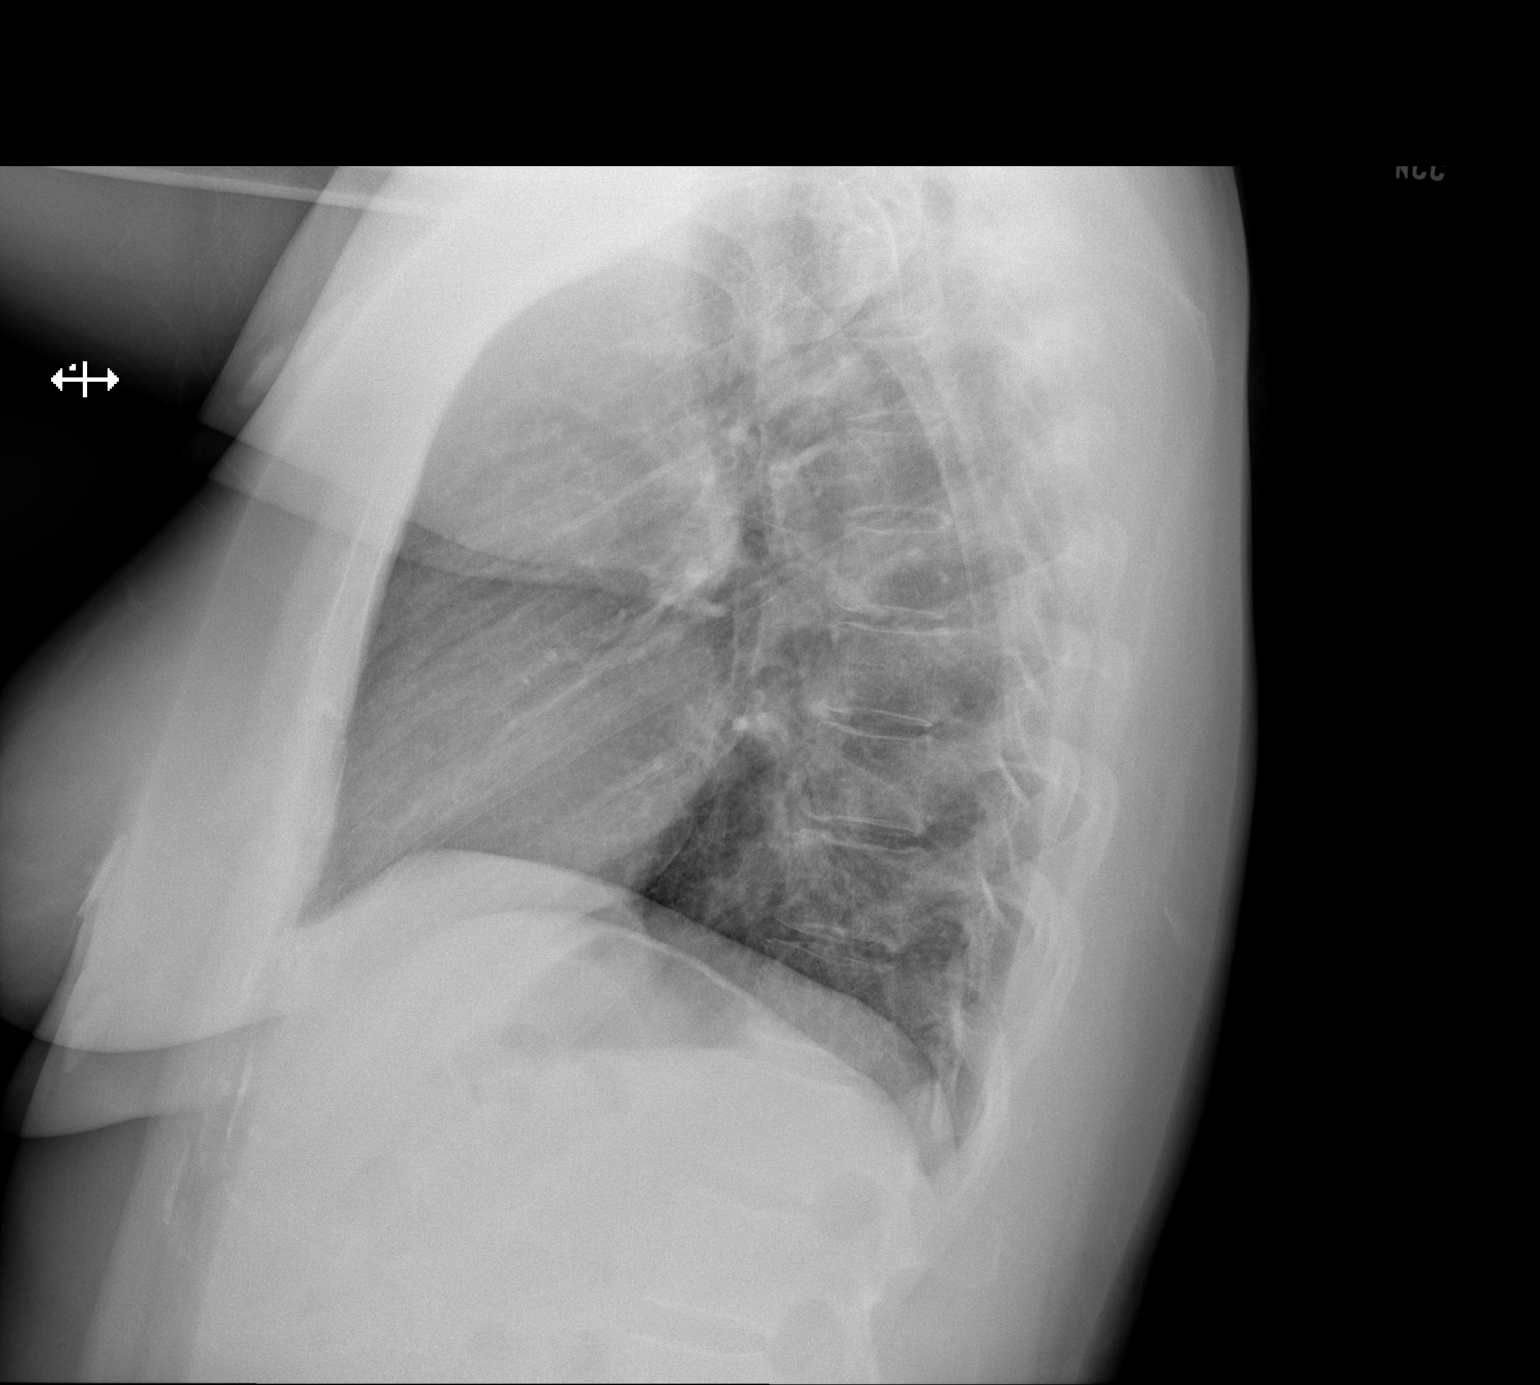

[2 of 2 positions shown; findings below may reference images not displayed]

FINDINGS: The heart size appears normal. There is no pleural effusion or
edema. Calcified granuloma identified in the left midlung.
IMPRESSION: 1. No acute cardiopulmonary abnormalities.

## 2019-02-19 ENCOUNTER — Other Ambulatory Visit: Payer: Self-pay | Admitting: Emergency Medicine

## 2019-02-19 DIAGNOSIS — Z20822 Contact with and (suspected) exposure to covid-19: Secondary | ICD-10-CM

## 2019-02-21 LAB — NOVEL CORONAVIRUS, NAA: SARS-CoV-2, NAA: NOT DETECTED

## 2019-04-09 ENCOUNTER — Other Ambulatory Visit: Payer: Self-pay

## 2019-04-09 DIAGNOSIS — Z20822 Contact with and (suspected) exposure to covid-19: Secondary | ICD-10-CM

## 2019-04-10 LAB — NOVEL CORONAVIRUS, NAA: SARS-CoV-2, NAA: NOT DETECTED

## 2019-04-14 ENCOUNTER — Encounter: Payer: Self-pay | Admitting: *Deleted

## 2019-07-14 ENCOUNTER — Encounter (HOSPITAL_COMMUNITY): Payer: Self-pay | Admitting: Emergency Medicine

## 2019-07-14 ENCOUNTER — Emergency Department (HOSPITAL_COMMUNITY): Payer: Self-pay

## 2019-07-14 ENCOUNTER — Emergency Department (HOSPITAL_COMMUNITY)
Admission: EM | Admit: 2019-07-14 | Discharge: 2019-07-14 | Disposition: A | Discharge status: Home / Self Care | Payer: Self-pay | Attending: Emergency Medicine | Admitting: Emergency Medicine

## 2019-07-14 ENCOUNTER — Other Ambulatory Visit: Payer: Self-pay

## 2019-07-14 DIAGNOSIS — R0789 Other chest pain: Secondary | ICD-10-CM | POA: Insufficient documentation

## 2019-07-14 DIAGNOSIS — G44209 Tension-type headache, unspecified, not intractable: Secondary | ICD-10-CM | POA: Insufficient documentation

## 2019-07-14 DIAGNOSIS — R079 Chest pain, unspecified: Secondary | ICD-10-CM

## 2019-07-14 DIAGNOSIS — R1010 Upper abdominal pain, unspecified: Secondary | ICD-10-CM | POA: Insufficient documentation

## 2019-07-14 DIAGNOSIS — R519 Headache, unspecified: Secondary | ICD-10-CM

## 2019-07-14 DIAGNOSIS — Z79899 Other long term (current) drug therapy: Secondary | ICD-10-CM | POA: Insufficient documentation

## 2019-07-14 DIAGNOSIS — J45909 Unspecified asthma, uncomplicated: Secondary | ICD-10-CM | POA: Insufficient documentation

## 2019-07-14 LAB — COMPREHENSIVE METABOLIC PANEL
ALT: 21 U/L (ref 0–44)
AST: 17 U/L (ref 15–41)
Albumin: 3.8 g/dL (ref 3.5–5.0)
Alkaline Phosphatase: 45 U/L (ref 38–126)
Anion gap: 9 (ref 5–15)
BUN: 11 mg/dL (ref 6–20)
CO2: 22 mmol/L (ref 22–32)
Calcium: 8.7 mg/dL — ABNORMAL LOW (ref 8.9–10.3)
Chloride: 108 mmol/L (ref 98–111)
Creatinine, Ser: 0.65 mg/dL (ref 0.44–1.00)
GFR calc Af Amer: >60 mL/min (ref >60)
GFR calc non Af Amer: >60 mL/min (ref >60)
Glucose, Bld: 92 mg/dL (ref 70–99)
Potassium: 4.2 mmol/L (ref 3.5–5.1)
Sodium: 139 mmol/L (ref 135–145)
Total Bilirubin: 0.9 mg/dL (ref 0.3–1.2)
Total Protein: 6.7 g/dL (ref 6.5–8.1)

## 2019-07-14 LAB — CBC
HCT: 41.3 % (ref 36.0–46.0)
Hemoglobin: 13.9 g/dL (ref 12.0–15.0)
MCH: 33 pg (ref 26.0–34.0)
MCHC: 33.7 g/dL (ref 30.0–36.0)
MCV: 98.1 fL (ref 80.0–100.0)
Platelets: 298 10*3/uL (ref 150–400)
RBC: 4.21 MIL/uL (ref 3.87–5.11)
RDW: 12.4 % (ref 11.5–15.5)
WBC: 7.9 10*3/uL (ref 4.0–10.5)
nRBC: 0 % (ref 0.0–0.2)

## 2019-07-14 LAB — TROPONIN I (HIGH SENSITIVITY)
Troponin I (High Sensitivity): <2 ng/L (ref <18)
Troponin I (High Sensitivity): <2 ng/L (ref <18)

## 2019-07-14 LAB — LIPASE, BLOOD: Lipase: 39 U/L (ref 11–51)

## 2019-07-14 LAB — I-STAT BETA HCG BLOOD, ED (MC, WL, AP ONLY): I-stat hCG, quantitative: <5 m[IU]/mL (ref <5)

## 2019-07-14 LAB — D-DIMER, QUANTITATIVE: D-Dimer, Quant: <0.27 ug/mL-FEU (ref 0.00–0.50)

## 2019-07-14 MED ORDER — OMEPRAZOLE 20 MG PO CPDR: 20.0000 mg | DELAYED_RELEASE_CAPSULE | Freq: Every day | ORAL | 0 refills | Status: DC

## 2019-07-14 MED ORDER — LIDOCAINE VISCOUS HCL 2 % MT SOLN
15.0000 mL | Freq: Once | OROMUCOSAL | Status: AC
  Administered 2019-07-14: 15 mL via ORAL
  Filled 2019-07-14: qty 15

## 2019-07-14 MED ORDER — ALUM & MAG HYDROXIDE-SIMETH 200-200-20 MG/5ML PO SUSP
30.0000 mL | Freq: Once | ORAL | Status: AC
  Administered 2019-07-14: 30 mL via ORAL
  Filled 2019-07-14: qty 30

## 2019-07-14 MED ORDER — PROCHLORPERAZINE EDISYLATE 10 MG/2ML IJ SOLN
10.0000 mg | Freq: Once | INTRAMUSCULAR | Status: AC
  Administered 2019-07-14: 11:00:00 10 mg via INTRAVENOUS
  Filled 2019-07-14: qty 2

## 2019-07-14 MED ORDER — PROCHLORPERAZINE MALEATE 10 MG PO TABS: 10.0000 mg | ORAL_TABLET | Freq: Three times a day (TID) | ORAL | 0 refills | Status: DC | PRN

## 2019-07-14 MED ORDER — DIPHENHYDRAMINE HCL 50 MG/ML IJ SOLN
25.0000 mg | Freq: Once | INTRAMUSCULAR | Status: AC
  Administered 2019-07-14: 25 mg via INTRAVENOUS
  Filled 2019-07-14: qty 1

## 2019-07-14 MED ORDER — SODIUM CHLORIDE 0.9% FLUSH: 3.0000 mL | Freq: Once | INTRAVENOUS | Status: DC

## 2019-07-14 MED ORDER — SODIUM CHLORIDE 0.9 % IV BOLUS
1000.0000 mL | Freq: Once | INTRAVENOUS | Status: AC
  Administered 2019-07-14: 11:00:00 1000 mL via INTRAVENOUS

## 2019-07-14 NOTE — Discharge Instructions (Signed)
We suspect her chest pain symptoms are related to your GI tract and esophagus given the improvement with the GI cocktail.  Your headache also improved after the medications.  Please use the Prilosec to help with stomach acid and the chest discomfort and please use the Compazine to help with nausea, headache, and the agitation.  Please rest and stay hydrated and try to help your anxiety.  Please follow-up with your primary doctor.  If any symptoms change or worsen, return to the nearest emergency department.

## 2019-07-14 NOTE — ED Triage Notes (Signed)
PT reports HA is better but her feet hurt now

## 2019-07-14 NOTE — ED Triage Notes (Signed)
C/o L sided headache, LUQ pain, L sided chest pain, SOB, and dizziness since last week.  Denies nausea and vomiting.

## 2019-07-14 NOTE — ED Provider Notes (Signed)
[facility] EMERGENCY DEPARTMENT Provider Note   CSN: [id number] Arrival date & time: 07/14/19  3267     History Chief Complaint  Patient presents with  . Headache  . Chest Pain  . Abdominal Pain    Rhylei Mcquaig is a 39 y.o. female.  The history is provided by the patient and medical records. No language interpreter was used.  Headache Pain location:  Generalized Quality:  Dull Radiates to:  Does not radiate Severity currently:  8/10 Severity at highest:  8/10 Onset quality:  Gradual Timing:  Constant Progression:  Unchanged Chronicity:  Recurrent Similar to prior headaches: yes   Relieved by:  Nothing Worsened by:  Nothing Associated symptoms: abdominal pain   Associated symptoms: no back pain, no congestion, no cough, no diarrhea, no dizziness, no fatigue, no fever, no nausea, no neck pain, no neck stiffness, no numbness, no vomiting and no weakness   Chest Pain Pain location:  L chest and L lateral chest Pain quality: aching and burning   Pain radiates to:  Epigastrium Pain severity:  Moderate Onset quality:  Gradual Timing:  Constant Progression:  Unchanged Chronicity:  New Worsened by:  Nothing Ineffective treatments:  None tried Associated symptoms: abdominal pain, headache and shortness of breath   Associated symptoms: no back pain, no cough, no diaphoresis, no dizziness, no fatigue, no fever, no nausea, no numbness, no palpitations, no vomiting and no weakness   Abdominal Pain Associated symptoms: chest pain and shortness of breath   Associated symptoms: no chills, no constipation, no cough, no diarrhea, no dysuria, no fatigue, no fever, no nausea and no vomiting        Past Medical History:  Diagnosis Date  . Asthma   . HLD (hyperlipidemia)   . Recurrent upper respiratory infection (URI)   . Shortness of breath    " with my asthma"    Patient Active Problem List   Diagnosis Date Noted  . EAR PAIN, LEFT  08/12/2009  . ANXIETY DEPRESSION 07/15/2009  . ALLERGIC RHINITIS 06/02/2009  . 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 OTITIS MEDIA, LEFT 04/02/2009  . HEADACHE 04/02/2009  . CHRONIC TENSION TYPE HEADACHE 02/03/2009  . INSOMNIA UNSPECIFIED 02/03/2009  . HYPERCHOLESTEROLEMIA 11/20/2008  . LABYRINTHITIS 10/29/2008    Past Surgical History:  Procedure Laterality Date  . CESAREAN SECTION    . TONSILLECTOMY       OB History   No obstetric history on file.     No family history on file.  Social History   Tobacco Use  . Smoking status: Never Smoker  . Smokeless tobacco: Never Used  Substance Use Topics  . Alcohol use: No  . Drug use: No    Home Medications Prior to Admission medications   Medication Sig Start Date End Date Taking? Authorizing Provider  albuterol (PROVENTIL HFA;VENTOLIN HFA) 108 (90 BASE) MCG/ACT inhaler Inhale 2 puffs into the lungs every 6 (six) hours as needed. Shortness of breath    [provider]  cyclobenzaprine (FLEXERIL) 10 MG tablet Take 1 tablet (10 mg total) by mouth 2 (two) times daily as needed for muscle spasms. 09/26/15   Elpidio Anis, PA-C  ibuprofen (ADVIL,MOTRIN) 200 MG tablet Take 400 mg by mouth every 6 (six) hours as needed for moderate pain.     [provider]  naproxen (NAPROSYN) 500 MG tablet Take 1 tablet (500 mg total) by mouth 2 (two) times daily. 09/26/15   Elpidio Anis, PA-C    Allergies  Patient has no known allergies.  Review of Systems   Review of Systems  Constitutional: Negative for chills, diaphoresis, fatigue and fever.  HENT: Negative for congestion.   Eyes: Negative for visual disturbance.  Respiratory: Positive for shortness of breath. Negative for cough, chest tightness and wheezing.   Cardiovascular: Positive for chest pain. Negative for palpitations and leg swelling.  Gastrointestinal: Positive for abdominal pain. Negative for constipation, diarrhea, nausea and vomiting.  Genitourinary: Negative  for dysuria, enuresis and flank pain.  Musculoskeletal: Negative for back pain, neck pain and neck stiffness.  Neurological: Positive for headaches. Negative for dizziness, facial asymmetry, speech difficulty, weakness, light-headedness and numbness.  Psychiatric/Behavioral: Negative for agitation.  All other systems reviewed and are negative.   Physical Exam Updated Vital Signs BP 98/61 (BP Location: Left Arm)   Pulse 80   Temp 98.6 F (37 C) (Oral)   Resp 18   LMP 07/04/2019   SpO2 98%   Physical Exam Vitals and nursing note reviewed.  Constitutional:      General: She is not in acute distress.    Appearance: She is well-developed. She is not ill-appearing, toxic-appearing or diaphoretic.  HENT:     Head: Normocephalic and atraumatic.     Mouth/Throat:     Mouth: Mucous membranes are moist.  Eyes:     Extraocular Movements: Extraocular movements intact.     Pupils: Pupils are equal, round, and reactive to light.  Cardiovascular:     Rate and Rhythm: Normal rate and regular rhythm.     Heart sounds: Normal heart sounds. No murmur.  Pulmonary:     Effort: Pulmonary effort is normal.     Breath sounds: No decreased breath sounds, wheezing, rhonchi or rales.  Chest:     Chest wall: No tenderness.  Abdominal:     General: Abdomen is flat. Bowel sounds are normal. There is no distension.     Palpations: Abdomen is soft.     Tenderness: There is abdominal tenderness in the left upper quadrant.  Musculoskeletal:        General: No swelling or tenderness. Normal range of motion.     Cervical back: Normal range of motion.     Right lower leg: No tenderness.     Left lower leg: No tenderness. No edema.  Skin:    General: Skin is warm.     Capillary Refill: Capillary refill takes less than 2 seconds.  Neurological:     General: No focal deficit present.     Mental Status: She is alert.  Psychiatric:        Mood and Affect: Mood normal.     ED Results / Procedures /  Treatments   Labs (all labs ordered are listed, but only abnormal results are displayed) Labs Reviewed  COMPREHENSIVE METABOLIC PANEL - Abnormal; Notable for the following components:      Result Value   Calcium 8.7 (*)    All other components within normal limits  URINE CULTURE  LIPASE, BLOOD  CBC  D-DIMER, QUANTITATIVE (NOT AT [facility])  URINALYSIS, ROUTINE W REFLEX MICROSCOPIC  I-STAT BETA HCG BLOOD, ED ([facility], [facility], [facility] ONLY)  TROPONIN I (HIGH SENSITIVITY)  TROPONIN I (HIGH SENSITIVITY)    EKG EKG Interpretation  Date/Time:  Monday July 14 2019 09:20:02 EST Ventricular Rate:  75 PR Interval:    QRS Duration: 78 QT Interval:  377 QTC Calculation: 421 R Axis:   62 Text Interpretation: Sinus rhythm When compared to prior, no significant changes  seen. No STEMI Confirmed by Antony Blackbird [phone]) on 07/14/2019 9:38:09 AM   Radiology DG Chest 2 View  Result Date: 07/14/2019 CLINICAL DATA:  Left chest pain and left upper quadrant abdominal pain EXAM: CHEST - 2 VIEW COMPARISON:  2017 FINDINGS: The heart size and mediastinal contours are within normal limits. No new consolidation or edema. Left mid lung calcified granuloma. No pleural effusion or pneumothorax. The visualized skeletal structures are unremarkable. No free air under the diaphragms. IMPRESSION: No acute process in the chest. Electronically Signed   By: Macy Mis M.D.   On: 07/14/2019 10:23    Procedures Procedures (including critical care time)  Medications Ordered in ED Medications  sodium chloride flush (NS) 0.9 % injection 3 mL (has no administration in time range)  prochlorperazine (COMPAZINE) injection 10 mg (10 mg Intravenous Given 07/14/19 1042)  diphenhydrAMINE (BENADRYL) injection 25 mg (25 mg Intravenous Given 07/14/19 1042)  sodium chloride 0.9 % bolus 1,000 mL (1,000 mLs Intravenous New Bag/Given 07/14/19 1037)  alum & mag hydroxide-simeth (MAALOX/MYLANTA) 200-200-20 MG/5ML suspension 30 mL (30 mLs Oral  Given 07/14/19 1039)    And  lidocaine (XYLOCAINE) 2 % viscous mouth solution 15 mL (15 mLs Oral Given 07/14/19 1039)    ED Course  I have reviewed the triage vital signs and the nursing notes.  Pertinent labs & imaging results that were available during my care of the patient were reviewed by me and considered in my medical decision making (see chart for details).    MDM Rules/Calculators/A&P                      Verdie Wilms is a 39 y.o. female with a past medical history significant for hyperlipidemia, asthma, and prior headaches who presents with left-sided chest pain, headache, lightheadedness, and some tingling.  Patient reports that for the last week he has been having left-sided chest pain that is worse with deep breathing and exertion.  Is also associate with some left upper quadrant abdominal pain and shortness of breath.  She denies any cough but does report some chills.  She has not had any fevers.  She reports she has headaches which she has had in the past that seem to be related.  She describes all of her discomfort as an 8 out of 10.  She reports the pain is improved from prior.  She reports no significant urinary symptoms or other GI symptoms.  She denies vomiting or diarrhea.  She denies history of Covid or any Covid exposures.  She denies trauma.  She reports no other symptoms on arrival.  On exam, chest is nontender.  Patient's left upper quadrant is slightly tender to palpation.  Left inferior chest is slightly tender.  Patient normal bowel sounds.  No murmur.  Good pulses in extremities.  Patient no focal neurologic deficits with no numbness, or weakness on exam.  Normal smile.  No facial numbness.  Pupils are symmetric and reactive with normal extraocular movements.  EKG shows no STEMI.  Heart score calculated as a 1.  Clinically I have low suspicion for concerning etiology.  I suspect she has a stress or migrainous type headache given her history of the same,  will give headache cocktail.  Will get D-dimer given the pleuritic nature of her left-sided chest discomfort and shortness of breath.  Will get chest x-ray and labs.  Given the left upper abdominal pain with the chest discomfort, will give GI cocktail see if this  helps as well.  Anticipate reassessment after work-up and she is feeling much better and work-up is reassuring, anticipate discharge home.  3:23 PM On reassessment, patient feeling much better after the headache cocktail.  Headache is gone and her chest pain has also improved.  Her labs were reassuring and her heart score was 1.  Delta troponin negative.  D-dimer negative.  GI cocktail was given and improved her discomfort in the chest and abdomen.  Suspect a component of GERD or reflux.  She will be given a prescription for Prilosec as well as a prescription for the Compazine for the headache and nausea.  Patient thinks it could be stress as well and she wants to talk to PCP about further stress management.  Patient understood return precautions and was discharged in good condition with improved symptoms.   Final Clinical Impression(s) / ED Diagnoses Final diagnoses:  Chest pain, unspecified type  Upper abdominal pain  Acute nonintractable headache, unspecified headache type    Rx / DC Orders ED Discharge Orders         Ordered    omeprazole (PRILOSEC) 20 MG capsule  Daily     07/14/19 1512    prochlorperazine (COMPAZINE) 10 MG tablet  Every 8 hours PRN     07/14/19 1512          Clinical Impression: 1. Chest pain, unspecified type   2. Upper abdominal pain   3. Acute nonintractable headache, unspecified headache type     Disposition: Discharge  Condition: Good  I have discussed the results, Dx and Tx plan with the pt(& family if present). He/she/they expressed understanding and agree(s) with the plan. Discharge instructions discussed at great length. Strict return precautions discussed and pt &/or family have  verbalized understanding of the instructions. No further questions at time of discharge.    New Prescriptions   OMEPRAZOLE (PRILOSEC) 20 MG CAPSULE    Take 1 capsule (20 mg total) by mouth daily.   PROCHLORPERAZINE (COMPAZINE) 10 MG TABLET    Take 1 tablet (10 mg total) by mouth every 8 (eight) hours as needed for nausea or vomiting (headache).    Follow Up: [facility] [address] [place] [address] [phone] Schedule an appointment as soon as possible for a visit    [facility] EMERGENCY DEPARTMENT [address] [id number] mc [place] [address] [phone]       Liliyana Thobe, Canary Brim, MD 07/14/19 1530

## 2019-07-15 ENCOUNTER — Telehealth: Payer: Self-pay | Admitting: *Deleted

## 2019-07-15 ENCOUNTER — Telehealth (HOSPITAL_COMMUNITY): Payer: Self-pay | Admitting: Emergency Medicine

## 2019-07-15 MED ORDER — PROCHLORPERAZINE MALEATE 10 MG PO TABS: 10.0000 mg | ORAL_TABLET | Freq: Three times a day (TID) | ORAL | 0 refills | Status: DC | PRN

## 2019-07-15 MED ORDER — OMEPRAZOLE 20 MG PO CPDR: 20.0000 mg | DELAYED_RELEASE_CAPSULE | Freq: Every day | ORAL | 0 refills | Status: DC

## 2019-07-15 NOTE — Telephone Encounter (Signed)
Pt called regarding Rx location.  EDCM reviewed chart to find that Rx was printed for pt to take to pharmacy of choice.  Pt found Rx and will have Rx filled promptly.

## 2019-07-15 NOTE — Telephone Encounter (Signed)
Updated meds to go to her pharmacy as she does not have paper copies

## 2019-07-23 ENCOUNTER — Ambulatory Visit: Payer: Self-pay | Attending: Family Medicine | Admitting: Family Medicine

## 2019-07-23 ENCOUNTER — Ambulatory Visit (HOSPITAL_BASED_OUTPATIENT_CLINIC_OR_DEPARTMENT_OTHER): Payer: Self-pay | Admitting: Pharmacist

## 2019-07-23 ENCOUNTER — Encounter: Payer: Self-pay | Admitting: Family Medicine

## 2019-07-23 ENCOUNTER — Other Ambulatory Visit: Payer: Self-pay

## 2019-07-23 VITALS — BP 112/79 | HR 77 | Resp 16 | Ht 65.5 in | Wt 162.6 lb

## 2019-07-23 DIAGNOSIS — Z603 Acculturation difficulty: Secondary | ICD-10-CM

## 2019-07-23 DIAGNOSIS — Z23 Encounter for immunization: Secondary | ICD-10-CM

## 2019-07-23 DIAGNOSIS — K219 Gastro-esophageal reflux disease without esophagitis: Secondary | ICD-10-CM

## 2019-07-23 DIAGNOSIS — R5383 Other fatigue: Secondary | ICD-10-CM

## 2019-07-23 DIAGNOSIS — R519 Headache, unspecified: Secondary | ICD-10-CM

## 2019-07-23 DIAGNOSIS — R1013 Epigastric pain: Secondary | ICD-10-CM

## 2019-07-23 DIAGNOSIS — Z09 Encounter for follow-up examination after completed treatment for conditions other than malignant neoplasm: Secondary | ICD-10-CM

## 2019-07-23 DIAGNOSIS — Z789 Other specified health status: Secondary | ICD-10-CM

## 2019-07-23 DIAGNOSIS — Z800 Family history of malignant neoplasm of digestive organs: Secondary | ICD-10-CM

## 2019-07-23 DIAGNOSIS — Z758 Other problems related to medical facilities and other health care: Secondary | ICD-10-CM

## 2019-07-23 DIAGNOSIS — G4700 Insomnia, unspecified: Secondary | ICD-10-CM

## 2019-07-23 MED ORDER — TRAZODONE HCL 50 MG PO TABS: 25.0000 mg | ORAL_TABLET | Freq: Every evening | ORAL | 3 refills | Status: DC | PRN

## 2019-07-23 MED ORDER — OMEPRAZOLE 20 MG PO CPDR: 20.0000 mg | DELAYED_RELEASE_CAPSULE | Freq: Two times a day (BID) | ORAL | 3 refills | Status: DC

## 2019-07-23 MED ORDER — PROCHLORPERAZINE MALEATE 10 MG PO TABS: 10.0000 mg | ORAL_TABLET | Freq: Three times a day (TID) | ORAL | 3 refills | Status: DC | PRN

## 2019-07-23 MED FILL — PROCHLORPERAZINE 10 MG TAB: 10 | 4 days supply | Qty: 10 | Fill #0

## 2019-07-23 MED FILL — OMEPRAZOLE 20 MG CAP: 20 | 60 days supply | Qty: 60 | Fill #0

## 2019-07-23 MED FILL — traZODone HCL 50 MG TABS: 50 | 30 days supply | Qty: 30 | Fill #0

## 2019-07-23 NOTE — Progress Notes (Addendum)
Subjective:  Patient ID: Peggy Monroe, female    DOB: 1981-06-09  Age: 39 y.o. MRN: [id number]  CC: New Patient (Initial Visit)   HPI Peggy Monroe, 39 year old female, who presents to establish care.  She is status post emergency department visit on 07/14/2019 secondary to complaint of left chest pain that was worse with deep breathing/exertion, and generalized headache that was an 8 out of 10 on a 0-to-10 scale.  EKG showed no significant changes and patient had improvement in headache after headache cocktail given in the ED.  She also had improvement of chest pain after GI cocktail and there was thought to be a component of GERD or acid reflux to her chest pain.  She was provided with prescription for Compazine for headache and nausea as well as Prilosec for acid reflux treatment.  Per ED notes, patient also thought that stress might be a factor.        At today's visit, she reports that she feels better than she did at her recent emergency department visit.  She still tends to have issues with fatigue as well as recurrent headaches.  She also reports that she has difficulty with sleeping, usually has issues falling asleep.  She reports that she would like a refill of the Compazine which was prescribed to help with headaches/nausea associated with headaches as she states that the medicine also causes her to feel drowsy and she can fall asleep.  She no longer feels as if she is having chest pain.  She does occasionally get some burning and discomfort in the mid upper abdomen.  She is taking the omeprazole 20 mg once daily prescribed at the emergency department.  She reports that her nausea however occurs mostly when she is also having a headache.  Headaches range from a 4-6 and sometimes greater.  Usually on the left side of the head at the temple and left side of the scalp.  Headaches are more frequent near the time for her menses.  She also has sensitivity to light and noise  during her headaches.  Headaches occur more frequently when she does not get enough sleep.  Past Medical History:  Diagnosis Date  . Asthma   . HLD (hyperlipidemia)   . Recurrent upper respiratory infection (URI)   . Shortness of breath    " with my asthma"    Past Surgical History:  Procedure Laterality Date  . CESAREAN SECTION    . TONSILLECTOMY      Family History  Problem Relation Age of Onset  . Colon cancer Father   . Diabetes Maternal Grandmother   . Diabetes Paternal Grandmother     Social History   Tobacco Use  . Smoking status: Never Smoker  . Smokeless tobacco: Never Used  Substance Use Topics  . Alcohol use: No    ROS Review of Systems  Constitutional: Positive for fatigue. Negative for chills and fever.  HENT: Positive for trouble swallowing (mild sensation of food getting stuck at level of thyroid). Negative for sore throat.   Eyes: Negative for photophobia and visual disturbance.  Respiratory: Negative for cough and shortness of breath.   Cardiovascular: Negative for chest pain, palpitations and leg swelling.  Gastrointestinal: Positive for abdominal pain (epigastric). Negative for constipation, diarrhea and nausea.  Endocrine: Negative for polydipsia, polyphagia and polyuria.  Genitourinary: Negative for dysuria and frequency.  Neurological: Positive for dizziness and headaches.  Hematological: Negative for adenopathy. Does not bruise/bleed easily.  Objective:   Today's Vitals: BP 112/79   Pulse 77   Resp 16   Ht 5' 5.5" (1.664 m)   Wt 162 lb 9.6 oz (73.8 kg)   LMP 07/04/2019   SpO2 100%   BMI 26.65 kg/m   Physical Exam Vitals and nursing note reviewed.  Constitutional:      Appearance: Normal appearance.  Neck:     Vascular: No carotid bruit.     Comments: Mild fullness at the level of the thyroid but no palpable thyromegaly or nodules Cardiovascular:     Rate and Rhythm: Normal rate and regular rhythm.  Pulmonary:     Effort:  Pulmonary effort is normal.     Breath sounds: Normal breath sounds.  Abdominal:     Palpations: Abdomen is soft.     Tenderness: There is abdominal tenderness (Epigastric tenderness to palpation). There is no right CVA tenderness, left CVA tenderness, guarding or rebound.  Musculoskeletal:        General: No swelling or tenderness.     Cervical back: Normal range of motion and neck supple. No tenderness.     Right lower leg: No edema.     Left lower leg: No edema.  Lymphadenopathy:     Cervical: No cervical adenopathy.  Skin:    General: Skin is warm and dry.  Neurological:     General: No focal deficit present.     Mental Status: She is alert and oriented to person, place, and time.     Cranial Nerves: No cranial nerve deficit.  Psychiatric:        Mood and Affect: Mood normal.        Behavior: Behavior normal.     Assessment & Plan:  1. Epigastric pain; encounter for examination following treatment at hospital Referral will be placed for patient to follow-up with gastroenterology due to her complaints of epigastric pain and patient with sensation of food getting stuck in her throat with swallowing.  She will also have blood test at today's visit for H. pylori.  Patient was previously on omeprazole once daily and prescription will be increased to twice daily.  Discussed avoidance of known trigger foods, limiting or avoiding nonsteroidal anti-inflammatory medication and avoiding eating within 2 hours of bedtime. - H Pylori, IGM, IGG, IGA AB - Ambulatory referral to Gastroenterology - omeprazole (PRILOSEC) 20 MG capsule; Take 1 capsule (20 mg total) by mouth 2 (two) times daily.  Dispense: 60 capsule; Refill: 3  2. Nonintractable episodic headache, unspecified headache type Refill of Compazine which was provided in the emergency department to help with headache/nausea associated with headaches.  Discussed with patient that she likely has migraine type headaches but she will also be  prescribed medication to help with sleep in case headaches are also related to sleep deprivation.  Patient may try over-the-counter Excedrin Migraine as needed.  Educational material on migraine headaches in Spanish, provided to the patient as part of after visit summary. - prochlorperazine (COMPAZINE) 10 MG tablet; Take 1 tablet (10 mg total) by mouth every 8 (eight) hours as needed for nausea or vomiting (headache).  Dispense: 10 tablet; Refill: 3  3. Gastroesophageal reflux disease, unspecified whether esophagitis present Patient with epigastric pain, acid reflux symptoms and complaint of mild dysphagia.  She will be referred to gastroenterology for further evaluation and treatment.  Prescription provided for omeprazole 20 mg twice daily and discussed avoidance of known trigger foods as well as avoidance of late night eating and avoidance of nonsteroidal  anti-inflammatory medications - Ambulatory referral to Gastroenterology  4. Insomnia, unspecified type Prescription provided for trazodone to take at bedtime to help with insomnia.  Patient should call or return to clinic if medication is not helping.  She should also make sure that she is in a dark quiet room without the presence of a TV or the distractions when she is trying to sleep - traZODone (DESYREL) 50 MG tablet; Take 0.5-1 tablets (25-50 mg total) by mouth at bedtime as needed for sleep.  Dispense: 30 tablet; Refill: 3  5. Family history of colon cancer in father She reports a family history of colon cancer in her father but she is not sure at what age her father had onset of colon cancer.  She will be referred to gastroenterology for further evaluation - Ambulatory referral to Gastroenterology  6. Other fatigue Patient with complaint of fatigue.  This is likely related to her issues with insomnia/poor sleep.  We will also check TSH to look for thyroid disorder which may be contributing.  She had a normal complete blood count and normal  comprehensive metabolic panel at her emergency department visit on 07/13/2018 on review of chart. - TSH  7.  Language barrier Stratus video interpretation system used at today's visit to help with communication of health information due to language barrier  8.  Need for tetanus immunization Patient was offered and received tetanus immunization at today's visit.  See note from clinical pharmacist.  Outpatient Encounter Medications as of 07/23/2019  Medication Sig  . omeprazole (PRILOSEC) 20 MG capsule Take 1 capsule (20 mg total) by mouth daily.  . prochlorperazine (COMPAZINE) 10 MG tablet Take 1 tablet (10 mg total) by mouth every 8 (eight) hours as needed for nausea or vomiting (headache).  Marland Kitchen albuterol (PROVENTIL HFA;VENTOLIN HFA) 108 (90 BASE) MCG/ACT inhaler Inhale 2 puffs into the lungs every 6 (six) hours as needed. Shortness of breath  . cyclobenzaprine (FLEXERIL) 10 MG tablet Take 1 tablet (10 mg total) by mouth 2 (two) times daily as needed for muscle spasms. (Patient not taking: Reported on 07/23/2019)  . ibuprofen (ADVIL,MOTRIN) 200 MG tablet Take 400 mg by mouth every 6 (six) hours as needed for moderate pain.   . naproxen (NAPROSYN) 500 MG tablet Take 1 tablet (500 mg total) by mouth 2 (two) times daily. (Patient not taking: Reported on 07/23/2019)   No facility-administered encounter medications on file as of 07/23/2019.    An After Visit Summary was printed and given to the patient.   Follow-up: Return in about 4 weeks (around 08/20/2019) for GERD/Sleep/HA; sooner if needed.    Cain Saupe MD

## 2019-07-23 NOTE — Progress Notes (Signed)
Patient presents for vaccination against tetanus per orders of Dr. Fulp. Consent given. Counseling provided. No contraindications exists. Vaccine administered without incident.   

## 2019-07-23 NOTE — Patient Instructions (Addendum)
Vacuna Td (ttanos y difteria): lo que debe saber Td (Tetanus, Diphtheria) Vaccine: What You Need to Know 1. Por qu vacunarse? La vacuna Td puede prevenir el ttanos y la difteria. El ttanos ingresa al organismo a travs de cortes o heridas. La difteria se contagia de persona a Social worker.  El [place] (T) provoca rigidez dolorosa en los msculos. El ttanos puede causar graves problemas de Morgan, como no poder abrir la boca, Warehouse manager dificultad para tragar y Industrial/product designer, o la muerte.  La DIFTERIA (D) puede causar dificultad para respirar, insuficiencia cardaca, parlisis o muerte. 2. Madilyn Fireman Td La vacuna Td es solo para nios de 7 aos en adelante, adolescentes y [place].  La vacuna Td habitualmente se aplica como dosis de refuerzo cada 10aos, pero tambin puede administrarse antes si la persona sufre una [place] o herida sucia y grave. En lugar de la vacuna Td, se puede utilizar otra vacuna llamada Tdap que, adems del ttanos y la difteria, protege contra la tos [place], tambin conocida como "tos convulsa".  La vacuna Td puede aplicarse al mismo tiempo que otras vacunas. 3. Hable con el mdico Comunquese con la persona que le coloca las vacunas si la persona que la recibe:  Ha tenido una reaccin alrgica despus de Neomia Dear dosis anterior de cualquier vacuna contra el ttanos o la difteria, o cualquier alergia grave, potencialmente mortal.  Alguna vez tuvo sndrome de Guillain-Barr (tambin llamado SGB).  Ha tenido dolor intenso o hinchazn despus de una dosis anterior de cualquier vacuna contra el ttanos o la difteria. En algunos casos, es posible que el mdico decida posponer la aplicacin de la vacuna Td para una visita en el futuro.  Las personas que sufren trastornos menores, como un resfro, pueden vacunarse. Las [employer] tienen enfermedades moderadas o graves generalmente deben esperar hasta recuperarse para poder recibir la vacunaTd.  Su mdico puede darle ms informacin. 4.  Riesgos de [place] reaccin a la vacuna  Despus de recibir la vacuna Td a veces se puede Surveyor, mining, enrojecimiento o Paramedic donde se aplic la inyeccin, fiebre leve, dolor de cabeza, sensacin de cansancio y nuseas, vmitos, diarrea o dolor de [place]. Las personas a veces se desmayan despus de procedimientos mdicos, incluida la vacunacin. Informe al mdico si se siente mareado, tiene cambios en la visin o zumbidos en los odos.  Al igual que con cualquier Automatic Data, existe una probabilidad muy remota de que una vacuna cause una reaccin alrgica grave, otra lesin grave o la muerte. 5. Qu pasa si se presenta un problema grave? Podra producirse una reaccin alrgica despus de que la persona vacunada abandone la clnica. Si observa signos de Runner, broadcasting/film/video grave (ronchas, hinchazn de la cara y la garganta, dificultad para respirar, latidos cardacos acelerados, mareos o debilidad), llame al 9-1-1 y lleve a la persona al hospital ms cercano.  Si se presentan otros signos que le preocupan, comunquese con su mdico.  Las reacciones adversas deben informarse al Sistema de Informe de Eventos Adversos de Administrator, arts (Vaccine Adverse Event Reporting System, VAERS). Por lo general, el mdico presenta este informe o puede hacerlo usted mismo. Visite el sitio web del VAERS en www.vaers.LAgents.no o llame al 401 037 2122. El VAERS es solo para Biomedical engineer; su personal no proporciona asesoramiento mdico. 6. Programa Nacional de Compensacin de Daos por American Electric [employer] El SunTrust de Compensacin de Daos por Administrator, arts (National Vaccine Injury Kohl's, Cabin crew) es un programa federal que fue creado para Patent examiner a las personas que puedan haber  sufrido daos al recibir ciertas vacunas. Visite el sitio web del VICP en GoldCloset.com.ee o llame al 1-778-224-3188 para obtener ms informacin acerca del programa y de cmo presentar un reclamo. Hay un lmite  de tiempo para presentar un reclamo de compensacin. 7. Cmo puedo obtener ms informacin?  Pregntele a su mdico.  Comunquese con el servicio de salud de su localidad o su estado.  Comunquese con Garment/textile technologist for [employer] and Prevention, CDC (Centros para el Control y [place]): ? Llame al 206-092-6361 (1-800-CDC-INFO) o ? Visite el sitio Biomedical engineer en http://hunter.com/ Declaracin de informacin sobre la vacuna Td (06/22/2018) Esta informacin no tiene Marine scientist el consejo del mdico. Asegrese de hacerle al mdico cualquier pregunta que tenga. Document Revised: 10/15/2018 Document Reviewed: 10/15/2018 Elsevier Patient Education  [place] migraosa Migraine Headache Una cefalea migraosa es un dolor muy intenso y punzante en uno o ambos lados de la cabeza. Este tipo de dolor de cabeza tambin puede causar otros sntomas. Puede durar desde 4horas hasta 3das. Hable con su mdico sobre las cosas que pueden causar (desencadenar) esta afeccin. Cules son las causas? Se desconoce la causa exacta de esta afeccin. Esta afeccin puede desencadenarse o ser causada por lo siguiente:  Consumo de alcohol.  Consumo de cigarrillos.  Tomar medicamentos como por ejemplo: ? Medicamentos para Best boy torcico (nitroglicerina). ? Anticonceptivos orales. ? Estrgeno. ? Algunos medicamentos para la presin arterial.  Comer o beber ciertos productos.  Hacer actividad fsica. Otros factores que pueden provocar cefalea migraosa son los siguientes:  Tener el perodo menstrual.  Glennis Brink.  Hambre.  Estrs.  No dormir lo suficiente o dormir demasiado.  Cambios climticos.  Cansancio (fatiga). Qu incrementa el riesgo?  Tener entre 25 y 10aos de edad.  Ser mujer.  Tener antecedentes familiares de cefalea migraosa.  Ser de Science writer.  Tener depresin o ansiedad.  Tener mucho  sobrepeso. Cules son los signos o los sntomas?  Un dolor punzante. Este dolor puede Citigroup siguientes caractersticas: ? Audiological scientist en cualquier regin de la cabeza, tanto de un lado como de [place]. ? Puede dificultar las actividades cotidianas. ? Puede empeorar con la actividad fsica. ? Puede empeorar con las luces brillantes o los ruidos fuertes.  Otros sntomas pueden incluir: ? Ganas de vomitar (nuseas). ? Vmitos. ? Mareos. ? Sensibilidad a las luces brillantes, los ruidos fuertes o [employer]AC[employer].  Antes de tener una cefalea migraosa, puede recibir seales de advertencia (aura). Un aura puede incluir: ? Ver luces intermitentes o tener puntos ciegos. ? Ver puntos brillantes, halos o lneas en zigzag. ? Tener una visin en tnel o visin borrosa. ? Sentir entumecimiento u hormigueo. ? Tener dificultad para hablar. ? Tener msculos dbiles.  Algunas personas tienen sntomas despus de una cefalea migraosa (fase posdromal), como los siguientes: ? Cansancio. ? Dificultad para pensar (concentrarse). Cmo se trata?  Tomar medicamentos para: ? Best boy. ? Aliviar la sensacin de Higher education careers adviser. ? Prevenir las Psychologist, occupational.  El tratamiento tambin puede incluir lo siguiente: ? Tomar sesiones de acupuntura. ? Evitar los alimentos que provocan las cefaleas migraosas. ? Aprender Orland Jarred de controlar las funciones corporales (biorretroalimentacin). ? Terapia para ayudarlo a Civil engineer, contracting y lidiar con los pensamientos negativos (terapia cognitivo conductual). Siga estas instrucciones en su casa: Medicamentos  Delphi de venta libre y los recetados solamente como se lo haya indicado el mdico.  Consulte a su mdico si el [employer]  que le recetaron: ? Hace que sea necesario que evite conducir o usar maquinaria pesada. ? Puede causarle dificultad para defecar (estreimiento). Es posible que deba tomar estas medidas para prevenir o tratar los  problemas para defecar:  Product manager suficiente lquido para Radio producer pis (la orina) de color amarillo plido.  Tomar medicamentos recetados o de [employer].  Comer alimentos ricos en fibra. Entre ellos, frijoles, cereales integrales y frutas y verduras frescas.  Limitar los alimentos con alto contenido de grasa y International aid/development worker. Estos incluyen alimentos fritos o dulces. Estilo de vida  No beba alcohol.  No consuma ningn producto que contenga nicotina o tabaco, como cigarrillos, cigarrillos electrnicos y tabaco de Theatre manager. Si necesita ayuda para dejar de fumar, consulte al mdico.  Duerma como mnimo 8horas todas las noches.  Limite el estrs y [place]. Indicaciones generales      Lleve un registro diario para Financial risk analyst lo que Advice worker. Registre, por ejemplo, lo siguiente: ? Lo que usted come y bebe. ? El tiempo que duerme. ? Algn cambio en lo que come o bebe. ? Algn cambio en sus medicamentos.  Si tiene una cefalea migraosa: ? Evite los factores que CS[employer] sntomas, como las luces brillantes. ? Resulta til acostarse en una habitacin oscura y silenciosa. ? No conduzca vehculos ni opere maquinaria pesada. ? Pregntele al mdico qu actividades son seguras para usted.  Concurra a todas las visitas de [address] se lo haya indicado el mdico. Esto es importante. Comunquese con un mdico si:  Tiene una cefalea migraosa que es diferente o peor que otras que ha tenido.  Tiene ms de [address] de cefalea por mes. Solicite ayuda inmediatamente si:  La cefalea migraosa [employer].  La cefalea migraosa dura ms de 72 horas.  Tiene fiebre.  Presenta rigidez en el cuello.  Tiene dificultad para ver.  Siente debilidad en los msculos o que no puede controlarlos.  Comienza a perder el equilibrio continuamente.  Comienza a tener dificultad para caminar.  Pierde el conocimiento (se desmaya).  Tiene una  convulsin. Resumen  [place] cefalea migraosa es un dolor muy intenso y punzante en uno o ambos lados de la cabeza. Estos dolores de [place] tambin pueden causar otros sntomas.  Esta afeccin puede tratarse con medicamentos y cambios en el estilo de vida.  Lleve un registro diario para Financial risk analyst lo que Advice worker.  Comunquese con un mdico si tiene una cefalea migraosa que es diferente o peor que otras que ha tenido.  Comunquese con el mdico si tiene ms de 15 das de cefalea en un mes. Esta informacin no tiene Theme park manager el consejo del mdico. Asegrese de hacerle al mdico cualquier pregunta que tenga. Document Revised: 08/16/2018 Document Reviewed: 08/16/2018 Elsevier Patient Education  2020 [employer].

## 2019-07-25 ENCOUNTER — Encounter: Payer: Self-pay | Admitting: Gastroenterology

## 2019-07-25 LAB — H PYLORI, IGM, IGG, IGA AB
H pylori, IgM Abs: <9 U (ref 0.0–8.9)
H. pylori, IgA Abs: 30 U — ABNORMAL HIGH (ref 0.0–8.9)
H. pylori, IgG AbS: 0.2 {index_val} (ref 0.00–0.79)

## 2019-07-25 LAB — TSH: TSH: 1 u[IU]/mL (ref 0.450–4.500)

## 2019-07-29 ENCOUNTER — Other Ambulatory Visit: Payer: Self-pay | Admitting: Family Medicine

## 2019-07-29 DIAGNOSIS — R1013 Epigastric pain: Secondary | ICD-10-CM

## 2019-07-29 DIAGNOSIS — R768 Other specified abnormal immunological findings in serum: Secondary | ICD-10-CM

## 2019-07-29 DIAGNOSIS — R7689 Other specified abnormal immunological findings in serum: Secondary | ICD-10-CM

## 2019-07-29 MED ORDER — AMOXICILLIN 500 MG PO CAPS: 1000.0000 mg | ORAL_CAPSULE | Freq: Two times a day (BID) | ORAL | 0 refills | Status: AC

## 2019-07-29 MED ORDER — OMEPRAZOLE 40 MG PO CPDR: 40.0000 mg | DELAYED_RELEASE_CAPSULE | Freq: Two times a day (BID) | ORAL | 0 refills | Status: DC

## 2019-07-29 MED ORDER — CLARITHROMYCIN 500 MG PO TABS: 500.0000 mg | ORAL_TABLET | Freq: Two times a day (BID) | ORAL | 0 refills | Status: AC

## 2019-07-29 MED FILL — AMOXICILLIN 500 MG CAPSULE: 500 | 10 days supply | Qty: 40 | Fill #0

## 2019-07-29 MED FILL — OMEPRAZOLE DR 40 MG CAPSULE: 40 | 30 days supply | Qty: 60 | Fill #0

## 2019-07-29 MED FILL — CLARITHROMYCIN 500 MG TAB: 500 | 10 days supply | Qty: 20 | Fill #0

## 2019-08-01 ENCOUNTER — Telehealth (INDEPENDENT_AMBULATORY_CARE_PROVIDER_SITE_OTHER): Payer: Self-pay

## 2019-08-01 NOTE — Telephone Encounter (Signed)
-----   Message from Cain Saupe, MD sent at 07/29/2019  2:48 PM EST ----- Patient with H. pylori IgA antibodies which may signal inflammation.  Will prescribe treatment for H. pylori but patient should return to clinic or keep follow-up with GI if continued epigastric pain or if unable to complete the antibiotic therapy for treatment of H. Pylori.  Normal thyroid blood test.  Please let patient know that prescriptions were sent to [facility] pharmacy

## 2019-08-01 NOTE — Telephone Encounter (Signed)
Call placed using pacific interpreter [phone]) patient is aware that she has  H. pylori IgA antibodies which may signal inflammation. Will prescribe treatment for H. pylori but patient should return to clinic or keep follow-up with GI if continued epigastric pain or if unable to complete the antibiotic therapy for treatment of H. Pylori. Normal thyroid blood test. Please let patient know that prescriptions were sent to [facility] pharmacy. She verbalized understanding of results. She will pick up Rx on Monday. Maryjean Morn, CMA

## 2019-09-01 ENCOUNTER — Ambulatory Visit (INDEPENDENT_AMBULATORY_CARE_PROVIDER_SITE_OTHER): Payer: Self-pay | Admitting: Gastroenterology

## 2019-09-01 ENCOUNTER — Encounter: Payer: Self-pay | Admitting: Gastroenterology

## 2019-09-01 VITALS — BP 108/68 | HR 72 | Temp 97.8°F | Ht 66.5 in | Wt 163.0 lb

## 2019-09-01 DIAGNOSIS — R1012 Left upper quadrant pain: Secondary | ICD-10-CM

## 2019-09-01 DIAGNOSIS — R768 Other specified abnormal immunological findings in serum: Secondary | ICD-10-CM

## 2019-09-01 DIAGNOSIS — Z01818 Encounter for other preprocedural examination: Secondary | ICD-10-CM

## 2019-09-01 DIAGNOSIS — R1013 Epigastric pain: Secondary | ICD-10-CM

## 2019-09-01 DIAGNOSIS — Z800 Family history of malignant neoplasm of digestive organs: Secondary | ICD-10-CM

## 2019-09-01 DIAGNOSIS — K219 Gastro-esophageal reflux disease without esophagitis: Secondary | ICD-10-CM

## 2019-09-01 MED ORDER — NA SULFATE-K SULFATE-MG SULF 17.5-3.13-1.6 GM/177ML PO SOLN: 1.0000 | Freq: Once | ORAL | 0 refills | Status: AC

## 2019-09-01 MED ORDER — OMEPRAZOLE 40 MG PO CPDR: 40.0000 mg | DELAYED_RELEASE_CAPSULE | Freq: Two times a day (BID) | ORAL | 3 refills | Status: DC

## 2019-09-01 NOTE — Patient Instructions (Addendum)
If you are age 41 or older, your body mass index should be between 23-30. Your Body mass index is 25.91 kg/m. If this is out of the aforementioned range listed, please consider follow up with your Primary Care Provider.  If you are age 28 or younger, your body mass index should be between 19-25. Your Body mass index is 25.91 kg/m. If this is out of the aformentioned range listed, please consider follow up with your Primary Care Provider.   You have been scheduled for an endoscopy and colonoscopy. Please follow the written instructions given to you at your visit today. Please pick up your prep supplies at the pharmacy within the next 1-3 days.  Continue Omeprazole at 40 mg two times a day.

## 2019-09-01 NOTE — Progress Notes (Addendum)
Referring Provider: Cain Saupe, MD Primary Care Physician:  Patient, No Pcp Per  Reason for Consultation: Epigastric pain   IMPRESSION:  LUQ pain x 2 years    - normal liver enzymes and lipase, normal CBC 07/14/19 H pylori IgA + - treated with antibiotics Reflux despite omeprazole 40 mg BID Asthma Hepatic steatosis Family history of colon cancer (father in his 29s, paternal cousin)  LUQ pain described as a ball or fullness:  I am unable to feel anything under the ribcage in the area of her concern. Abdominal ultrasound at [facility] in 2019 showed no source. Recent LFTs and lipase are normal.  Differential includes: functional, muskuloskeletal, mass, splenic enlargement. EGD and colonoscopy recommended for other reasons. Contrasted CT scan if endoscopic evaluation is negative.   Reflux and recent H pylori IgA+: Symptoms are not associated with LUQ pain. Some improvement with H pylori treatment. Will increase PPI to 40 mg BID. Reviewed lifestyle modifications. EGD with gastric biopsies recommended.   Family history of colon cancer: Colonoscopy recommended.   Hepatic steatosis on ultrasound with normal liver enzymes.   PLAN: Increase omeprazole to 40 mg BID Reviewed reflux lifestyle modification EGD Colonoscopy CT and abdomen and pelvis with contrast if endoscopy evaluation is recommendation  Please see the "Patient Instructions" section for addition details about the plan.  I spent 45 minutes of time, including in depth chart review, independent review of results as outlined above, communicating results with the patient directly, face-to-face time with the patient, coordinating care, ordering studies and medications as appropriate, and documentation.   HPI: Peggy Monroe is a 39 y.o. female referred by Dr. Pamala Hurry for further evaluation of epigastric pain.  The history is obtained through the patient and review of her electronic health record with the help of a Spanish  interpreter.   She has a history of asthma, hyperlipidemia. She is a Advertising copywriter. Originally from [place]. Lived in the [place] for 15 years.   Seen by Dr. Fara Boros a GI at [facility] 07/16/2017 for a feeling of a ball in the left upper quadrant underneath the rib cage.  Abdominal ultrasound was normal.  Colonoscopy was recommended and scheduled but ultimately 09/19/2017.  Seen in the ED 07/14/2019 for left chest pain and headache.  Symptoms improved after GI cocktail.  She was prescribed Compazine for headache and nausea as well as omeprazole 20 mg daily for reflux.  Nausea occurs primarily with headaches.  On follow-up with Dr. Pamala Hurry 07/23/2019 she reported epigastric pain and dysphagia.  Omeprazole was increased to 20 mg twice daily.  Testing for H. pylori 07/23/2019 showed positive H. pylori IgA.Marland Kitchen  Antibiotic treatment provided incomplete relief.  Today she reports a one year history of feeling something there. Feels like a "ball" located underneath her left rib. Hurts with heavy lifting. No change with eating or defecation.  Associated dizziness. Feels like it's more intense than it was when she was evaluated [facility]   Denies heartburn, dysphagia or odynophagia, dysphonia, change in bowel habits, blood in the stool. Frequent cough and throat clearing.   No change with avoiding spicy foods, condiments, or spicy foods as part of a colon cleanse.  She has not tried any other OTC or prescription medications.  Appetite is good. Has intentionally lost 25 pounds over the last 2 years.   Patient interested in endoscopy.   Abdominal ultrasound 06/30/2015: Hepatomegaly, hepatic steatosis.  No abdominal mass. Chest x-ray 07/14/2019 for left chest pain and left upper quadrant abdominal  pain: Normal  Labs 07/14/2019 showed a normal comprehensive metabolic panel including normal liver enzymes.  Lipase was 39.  CBC was normal.  Father with colon cancer. Paternal cousin with colon cancer.  No known family history  of colon cancer or polyps. No family history of uterine/endometrial cancer, pancreatic cancer or gastric/stomach cancer. No family history of IBD.    Past Medical History:  Diagnosis Date  . Asthma   . HLD (hyperlipidemia)   . Recurrent upper respiratory infection (URI)   . Shortness of breath    " with my asthma"    Past Surgical History:  Procedure Laterality Date  . CESAREAN SECTION    . TONSILLECTOMY      Current Outpatient Medications  Medication Sig Dispense Refill  . albuterol (PROVENTIL HFA;VENTOLIN HFA) 108 (90 BASE) MCG/ACT inhaler Inhale 2 puffs into the lungs every 6 (six) hours as needed. Shortness of breath    . cyclobenzaprine (FLEXERIL) 10 MG tablet Take 1 tablet (10 mg total) by mouth 2 (two) times daily as needed for muscle spasms. (Patient not taking: Reported on 07/23/2019) 20 tablet 0  . ibuprofen (ADVIL,MOTRIN) 200 MG tablet Take 400 mg by mouth every 6 (six) hours as needed for moderate pain.     . naproxen (NAPROSYN) 500 MG tablet Take 1 tablet (500 mg total) by mouth 2 (two) times daily. (Patient not taking: Reported on 07/23/2019) 30 tablet 0  . omeprazole (PRILOSEC) 20 MG capsule Take 1 capsule (20 mg total) by mouth 2 (two) times daily. 60 capsule 3  . omeprazole (PRILOSEC) 40 MG capsule Take 1 capsule (40 mg total) by mouth 2 (two) times daily. 60 capsule 0  . prochlorperazine (COMPAZINE) 10 MG tablet Take 1 tablet (10 mg total) by mouth every 8 (eight) hours as needed for nausea or vomiting (headache). 10 tablet 3  . traZODone (DESYREL) 50 MG tablet Take 0.5-1 tablets (25-50 mg total) by mouth at bedtime as needed for sleep. 30 tablet 3   No current facility-administered medications for this visit.    Allergies as of 09/01/2019  . (No Known Allergies)    Family History  Problem Relation Age of Onset  . Colon cancer Father   . Diabetes Maternal Grandmother   . Diabetes Paternal Grandmother     Social History   Socioeconomic History  . Marital  status: Legally Separated    Spouse name: Not on file  . Number of children: Not on file  . Years of education: Not on file  . Highest education level: Not on file  Occupational History  . Not on file  Tobacco Use  . Smoking status: Never Smoker  . Smokeless tobacco: Never Used  Substance and Sexual Activity  . Alcohol use: No  . Drug use: No  . Sexual activity: Not Currently    Birth control/protection: None  Other Topics Concern  . Not on file  Social History Narrative  . Not on file   Social Determinants of Health   Financial Resource Strain:   . Difficulty of Paying Living Expenses:   Food Insecurity:   . Worried About Programme researcher, broadcasting/film/video in the Last Year:   . Barista in the Last Year:   Transportation Needs:   . Freight forwarder (Medical):   Marland Kitchen Lack of Transportation (Non-Medical):   Physical Activity:   . Days of Exercise per Week:   . Minutes of Exercise per Session:   Stress:   . Feeling of Stress :  Social Connections:   . Frequency of Communication with Friends and Family:   . Frequency of Social Gatherings with Friends and Family:   . Attends Religious Services:   . Active Member of Clubs or Organizations:   . Attends Archivist Meetings:   Marland Kitchen Marital Status:   Intimate Partner Violence:   . Fear of Current or Ex-Partner:   . Emotionally Abused:   Marland Kitchen Physically Abused:   . Sexually Abused:     Review of Systems: 12 system ROS is negative except as noted above.   Physical Exam: General:   Alert,  well-nourished, pleasant and cooperative in NAD Head:  Normocephalic and atraumatic. Eyes:  Sclera clear, no icterus.   Conjunctiva pink. Ears:  Normal auditory acuity. Nose:  No deformity, discharge,  or lesions. Mouth:  No deformity or lesions.   Neck:  Supple; no masses or thyromegaly. Lungs:  Clear throughout to auscultation.   No wheezes. Heart:  Regular rate and rhythm; no murmurs. Abdomen:  Soft, tender in the LUQ with  superificial and deep palpation, nondistended, normal bowel sounds, no rebound or guarding. No hepatosplenomegaly.   Rectal:  Deferred  Msk:  Symmetrical. No boney deformities LAD: No inguinal or umbilical LAD Extremities:  No clubbing or edema. Neurologic:  Alert and  oriented x4;  grossly nonfocal Skin:  Intact without significant lesions or rashes. Psych:  Alert and cooperative. Normal mood and affect.    Lawsen Arnott L. Tarri Glenn, MD, MPH 09/01/2019, 8:23 AM

## 2019-09-10 ENCOUNTER — Encounter: Payer: Self-pay | Admitting: Family Medicine

## 2019-09-10 ENCOUNTER — Other Ambulatory Visit: Payer: Self-pay

## 2019-09-10 ENCOUNTER — Ambulatory Visit: Payer: Self-pay | Attending: Family Medicine | Admitting: Family Medicine

## 2019-09-10 VITALS — BP 98/65 | HR 87 | Temp 98.0°F | Ht 66.5 in | Wt 161.2 lb

## 2019-09-10 DIAGNOSIS — M79671 Pain in right foot: Secondary | ICD-10-CM

## 2019-09-10 DIAGNOSIS — Z603 Acculturation difficulty: Secondary | ICD-10-CM

## 2019-09-10 DIAGNOSIS — Z8619 Personal history of other infectious and parasitic diseases: Secondary | ICD-10-CM

## 2019-09-10 DIAGNOSIS — R519 Headache, unspecified: Secondary | ICD-10-CM

## 2019-09-10 DIAGNOSIS — Z758 Other problems related to medical facilities and other health care: Secondary | ICD-10-CM

## 2019-09-10 DIAGNOSIS — R202 Paresthesia of skin: Secondary | ICD-10-CM

## 2019-09-10 DIAGNOSIS — M79672 Pain in left foot: Secondary | ICD-10-CM

## 2019-09-10 DIAGNOSIS — R200 Anesthesia of skin: Secondary | ICD-10-CM

## 2019-09-10 DIAGNOSIS — K219 Gastro-esophageal reflux disease without esophagitis: Secondary | ICD-10-CM

## 2019-09-10 DIAGNOSIS — Z789 Other specified health status: Secondary | ICD-10-CM

## 2019-09-10 MED ORDER — PROCHLORPERAZINE MALEATE 10 MG PO TABS: 10.0000 mg | ORAL_TABLET | Freq: Three times a day (TID) | ORAL | 3 refills | Status: DC | PRN

## 2019-09-10 MED FILL — PROCHLORPERAZINE 10 MG TAB: 10 | 3 days supply | Qty: 10 | Fill #0

## 2019-09-10 NOTE — Progress Notes (Signed)
Subjective:  Patient ID: Peggy Monroe, female    DOB: 1981-05-30  Age: 39 y.o. MRN: [id number]  CC: No chief complaint on file.   HPI Peggy Monroe, 39 year old Hispanic female, who presents for follow-up of GERD and prior positive H. pylori test.  Since her last visit, she has followed up with gastroenterology.  She reports that she is here today to have a test to make sure that her H. pylori infection has gone away after treatment.  She reports that she still has some mild reflux symptoms.  She denies any current abdominal pain at today's visit.  She has eaten prior to today's visit and she does take medication to reduce stomach acid.        She also reports that she is having issues with bilateral foot pain.  Foot pain is worse with walking or standing.  She does not feel as if she has increased pain when she initially gets up in the morning or takes her first few steps.  She also feels as if she has some numbness and tingling in her feet and this usually occurs in the left foot.  She denies any known history of prior issues with back pain or herniated disc.  No prior history of sciatica.  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  She denies any prior history of prediabetes or diabetes.  She denies urinary frequency, urgency or dysuria.  No chest pain or palpitations, no current issues with shortness of breath or cough but she does have asthma which she feels is well controlled at this time.  She has had some issues with recurrent headaches which can start at the front or either side of the head.  She reports that in the past she was given a medicine at the emergency department, Compazine as she reports that this helped not only with headaches but reflux symptoms and she would like to have a new prescription for this medication.    Past Medical History:  Diagnosis Date  . Asthma   . HLD (hyperlipidemia)   . Recurrent upper respiratory  infection (URI)   . Shortness of breath    " with my asthma"    Past Surgical History:  Procedure Laterality Date  . CESAREAN SECTION    . TONSILLECTOMY      Family History  Problem Relation Age of Onset  . Colon cancer Father   . Diabetes Maternal Grandmother   . Diabetes Paternal Grandmother     Social History   Tobacco Use  . Smoking status: Never Smoker  . Smokeless tobacco: Never Used  Substance Use Topics  . Alcohol use: No    ROS Review of Systems  Constitutional: Positive for fatigue. Negative for chills and fever.  HENT: Negative for sore throat and trouble swallowing.   Eyes: Negative for photophobia and visual disturbance.  Respiratory: Negative for cough and shortness of breath.   Cardiovascular: Negative for chest pain and palpitations.  Gastrointestinal: Positive for abdominal pain. Negative for blood in stool, constipation, diarrhea and nausea.  Endocrine: Negative for polydipsia, polyphagia and polyuria.  Genitourinary: Negative for dysuria and frequency.  Musculoskeletal: Positive for arthralgias and gait problem.  Neurological: Positive for headaches. Negative for dizziness.  Hematological: Negative for adenopathy. Does not bruise/bleed easily.  Psychiatric/Behavioral: Negative for self-injury and suicidal ideas. The patient is not nervous/anxious.     Objective:   Today's Vitals: BP 98/65 (BP Location: Left  Arm, Patient Position: Sitting, Cuff Size: Large)   Pulse 87   Temp 98 F (36.7 C) (Oral)   Ht 5' 6.5" (1.689 m)   Wt 161 lb 3.2 oz (73.1 kg)   LMP 08/13/2019 (Approximate)   SpO2 96%   BMI 25.63 kg/m   Physical Exam Vitals and nursing note reviewed.  Constitutional:      Appearance: Normal appearance.  Cardiovascular:     Rate and Rhythm: Normal rate and regular rhythm.  Pulmonary:     Effort: Pulmonary effort is normal.     Breath sounds: Normal breath sounds.  Abdominal:     Palpations: Abdomen is soft.     Tenderness: There  is no abdominal tenderness. There is no right CVA tenderness, left CVA tenderness, guarding or rebound.  Musculoskeletal:        General: No tenderness.     Cervical back: Normal range of motion. No tenderness.     Right lower leg: No edema.     Left lower leg: No edema.  Lymphadenopathy:     Cervical: No cervical adenopathy.  Skin:    General: Skin is warm and dry.  Neurological:     General: No focal deficit present.     Mental Status: She is alert and oriented to person, place, and time.  Psychiatric:        Mood and Affect: Mood normal.        Behavior: Behavior normal.     Assessment & Plan:  1. History of Helicobacter pylori infection She reports that she is here today to have testing to make sure that her H. pylori infection has cleared up.  On review of chart, she was recently seen by gastroenterology on 09/01/2019.  Per GI note, EGD with biopsies were recommended due to her H. pylori.  She is also supposed to have upcoming abdominal CT in follow-up of her GI complaints including left upper quadrant pain.  I discussed with the patient that H. pylori breath test can be done to see if she has had eradication of her H. pylori infection however she will need to discuss with the lab technician/phlebotomist the conditions under which this testing can be done as I believe that she will need to discontinue use of PPI therapy as well as possibly be fasting when she comes to have this test done.  Future lab order placed for H. pylori breath test but patient also advised to keep follow-up with gastroenterology regarding CT scan and EGD. - H. pylori breath test; Future  2. Gastroesophageal reflux disease, unspecified whether esophagitis present She is encouraged to continue omeprazole 40 mg twice daily as per gastroenterology, avoid known trigger foods as well as avoidance of late night eating.  She request prescription for Compazine to help with reflux symptoms as well as headaches and she  states that she received this medication at a prior ED visit and the medication helped with both issues.  Future lab visit order was placed for H. pylori breath test as patient reported that she wishes to have this test done in follow-up of treatment for H. pylori but she was also encouraged to keep GI follow-up for recommended EGD and CT scan. - prochlorperazine (COMPAZINE) 10 MG tablet; Take 1 tablet (10 mg total) by mouth every 8 (eight) hours as needed for nausea or vomiting (headache).  Dispense: 10 tablet; Refill: 3 - H. pylori breath test; Future  3. Bilateral foot pain Referral to podiatry placed due to patient's complaints of  bilateral foot pain.  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  - Ambulatory referral to Podiatry  4. Nonintractable episodic headache, unspecified headache type She reports in the past she was prescribed Compazine at an ED visit for headache and she felt that this medication was helpful.  She would like to have a refill of the medication at today's visit. - prochlorperazine (COMPAZINE) 10 MG tablet; Take 1 tablet (10 mg total) by mouth every 8 (eight) hours as needed for nausea or vomiting (headache).  Dispense: 10 tablet; Refill: 3  5. Language barrier Stratus video interpretation system used at today's visit to help with language barrier  6. Numbness and tingling of both feet She reports occasional numbness and tingling of both feet.  We will check vitamin B12 level to look for vitamin B12 deficiency, hemoglobin A1c to look for prediabetes or diabetes which may be contributing to her numbness and tingling as well as TSH to look for thyroid disorder which may be causing her symptoms.  She is additionally being referred to podiatry for further evaluation and treatment as she also reports bilateral foot pain. - Vitamin B12 - Hemoglobin A1c - TSH  Outpatient Encounter Medications as of 09/10/2019    Medication Sig  . albuterol (PROVENTIL HFA;VENTOLIN HFA) 108 (90 BASE) MCG/ACT inhaler Inhale 2 puffs into the lungs every 6 (six) hours as needed. Shortness of breath  . cyclobenzaprine (FLEXERIL) 10 MG tablet Take 1 tablet (10 mg total) by mouth 2 (two) times daily as needed for muscle spasms.  Marland Kitchen omeprazole (PRILOSEC) 40 MG capsule Take 1 capsule (40 mg total) by mouth 2 (two) times daily.  . traZODone (DESYREL) 50 MG tablet Take 0.5-1 tablets (25-50 mg total) by mouth at bedtime as needed for sleep.  Marland Kitchen ibuprofen (ADVIL,MOTRIN) 200 MG tablet Take 400 mg by mouth every 6 (six) hours as needed for moderate pain.   Marland Kitchen prochlorperazine (COMPAZINE) 10 MG tablet Take 1 tablet (10 mg total) by mouth every 8 (eight) hours as needed for nausea or vomiting (headache). (Patient not taking: Reported on 09/10/2019)   No facility-administered encounter medications on file as of 09/10/2019.    An After Visit Summary was printed and given to the patient.   Follow-up:  Return in about 4 months (around 01/10/2020) for chronic issues; schedule lab visit for H. Pylori breath test.  Greater than 30 minutes of face-to-face time was spent with the patient during today's visit as well as additional time of 12 minutes or more our review of chart, recent specialty notes, labs as well as completion of today's visit note.   Cain Saupe MD

## 2019-09-10 NOTE — Progress Notes (Signed)
GERD follow up  H-pylori follow up

## 2019-09-11 LAB — HEMOGLOBIN A1C
Est. average glucose Bld gHb Est-mCnc: 108 mg/dL
Hgb A1c MFr Bld: 5.4 % (ref 4.8–5.6)

## 2019-09-11 LAB — TSH: TSH: 0.778 u[IU]/mL (ref 0.450–4.500)

## 2019-09-11 LAB — VITAMIN B12: Vitamin B-12: 532 pg/mL (ref 232–1245)

## 2019-09-24 ENCOUNTER — Encounter: Payer: Self-pay | Admitting: *Deleted

## 2019-09-30 ENCOUNTER — Ambulatory Visit (INDEPENDENT_AMBULATORY_CARE_PROVIDER_SITE_OTHER): Payer: Self-pay

## 2019-09-30 ENCOUNTER — Other Ambulatory Visit: Payer: Self-pay | Admitting: Gastroenterology

## 2019-09-30 DIAGNOSIS — Z1159 Encounter for screening for other viral diseases: Secondary | ICD-10-CM

## 2019-09-30 LAB — SARS CORONAVIRUS 2 (TAT 6-24 HRS): SARS Coronavirus 2: NEGATIVE

## 2019-10-02 ENCOUNTER — Other Ambulatory Visit: Payer: Self-pay

## 2019-10-02 ENCOUNTER — Encounter: Payer: Self-pay | Admitting: Gastroenterology

## 2019-10-02 ENCOUNTER — Ambulatory Visit (AMBULATORY_SURGERY_CENTER): Payer: Self-pay | Admitting: Gastroenterology

## 2019-10-02 VITALS — BP 96/54 | HR 61 | Temp 95.9°F | Resp 14 | Ht 66.0 in | Wt 163.0 lb

## 2019-10-02 DIAGNOSIS — Z1211 Encounter for screening for malignant neoplasm of colon: Secondary | ICD-10-CM

## 2019-10-02 DIAGNOSIS — K2950 Unspecified chronic gastritis without bleeding: Secondary | ICD-10-CM

## 2019-10-02 DIAGNOSIS — K2970 Gastritis, unspecified, without bleeding: Secondary | ICD-10-CM

## 2019-10-02 DIAGNOSIS — K219 Gastro-esophageal reflux disease without esophagitis: Secondary | ICD-10-CM

## 2019-10-02 DIAGNOSIS — Z800 Family history of malignant neoplasm of digestive organs: Secondary | ICD-10-CM

## 2019-10-02 DIAGNOSIS — R1013 Epigastric pain: Secondary | ICD-10-CM

## 2019-10-02 DIAGNOSIS — K228 Other specified diseases of esophagus: Secondary | ICD-10-CM

## 2019-10-02 DIAGNOSIS — K3189 Other diseases of stomach and duodenum: Secondary | ICD-10-CM

## 2019-10-02 MED ORDER — SODIUM CHLORIDE 0.9 % IV SOLN: 500.0000 mL | Freq: Once | INTRAVENOUS | Status: DC

## 2019-10-02 NOTE — Progress Notes (Signed)
To PACU, VSS. Report to [email or web address] 

## 2019-10-02 NOTE — Op Note (Signed)
[facility] Patient Name: Peggy Monroe Procedure Date: 10/02/2019 1:27 PM MRN: [id number] Endoscopist: Tressia Danas MD, MD Age: 39 Referring MD:  Date of Birth: 1980-08-12 Gender: Female Account #: [id number] Procedure:                Upper GI endoscopy Indications:              Abdominal pain in the left upper quadrant,                            Follow-up of Helicobacter pylori                           LUQ pain x 2 years                           - normal liver enzymes and lipase, normal CBC                            07/14/19                           H pylori IgA + - treated with antibiotics                           Reflux despite omeprazole 40 mg BID                           Asthma Medicines:                Monitored Anesthesia Care Procedure:                Pre-Anesthesia Assessment:                           - Prior to the procedure, a History and Physical                            was performed, and patient medications and                            allergies were reviewed. The patient's tolerance of                            previous anesthesia was also reviewed. The risks                            and benefits of the procedure and the sedation                            options and risks were discussed with the patient.                            All questions were answered, and informed consent                            was obtained. Prior Anticoagulants: The  patient has                            taken no previous anticoagulant or antiplatelet                            agents. ASA Grade Assessment: II - A patient with                            mild systemic disease. After reviewing the risks                            and benefits, the patient was deemed in                            satisfactory condition to undergo the procedure.                           After obtaining informed consent, the endoscope was                             passed under direct vision. Throughout the                            procedure, the patient's blood pressure, pulse, and                            oxygen saturations were monitored continuously. The                            Endoscope was introduced through the mouth, and                            advanced to the third part of duodenum. The upper                            GI endoscopy was accomplished without difficulty.                            The patient tolerated the procedure well. Scope In: Scope Out: Findings:                 The examined esophagus was normal except for a                            mildly irregular z-line located 38 cm from the                            incisors. Biopsies were obtained from the proximal                            and distal esophagus with cold forceps for  histology of suspected eosinophilic esophagitis and                            reflux.                           Diffuse moderate inflammation characterized by                            friability, granularity and linear erosions was                            found in the gastric body and in the gastric                            antrum. Biopsies were taken from the antrum, body,                            and fundus with a cold forceps for histology.                            Estimated blood loss was minimal.                           The examined duodenum was normal. Biopsies were                            taken with a cold forceps for histology. Estimated                            blood loss was minimal.                           The cardia and gastric fundus were normal on                            retroflexion.                           The exam was otherwise without abnormality. Complications:            No immediate complications. Estimated blood loss:                            Minimal. Estimated Blood Loss:     Estimated blood loss was  minimal. Impression:               - Normal esophagus. Biopsied.                           - Gastritis. Biopsied.                           - Normal examined duodenum. Biopsied.                           - The examination was otherwise normal. Recommendation:           -  Patient has a contact number available for                            emergencies. The signs and symptoms of potential                            delayed complications were discussed with the                            patient. Return to normal activities tomorrow.                            Written discharge instructions were provided to the                            patient.                           - Resume previous diet.                           - Continue present medications including omeprazole                            40 mg BID.                           - No aspirin, ibuprofen, naproxen, or other                            non-steroidal anti-inflammatory drugs.                           - Await pathology results. Proceed with contrasted                            cross-sectional imaging if biopsies are negative. Tressia Danas MD, MD 10/02/2019 2:04:16 PM This report has been signed electronically.

## 2019-10-02 NOTE — Progress Notes (Signed)
Interpreter used today at the [facility] for this pt.  Interpreter's name is-Ana 

## 2019-10-02 NOTE — Patient Instructions (Signed)
No aspirin, ibuprofen, naproxen, or other non-steriodal anti-inflammatory drugs.   Handout provided on Gastritis and Hemorrhoids.   USTED TUVO UN PROCEDIMIENTO ENDOSCPICO HOY EN EL [facility]:   Lea el informe del procedimiento que se le entreg para cualquier pregunta especfica sobre lo que se Dentist.  Si el informe del examen no responde a sus preguntas, por favor llame a su gastroenterlogo para aclararlo.  Si usted solicit que no se le den [employer] de lo que se Clinical cytogeneticist en su procedimiento al [employer] va a cuidar, entonces el informe del procedimiento se ha incluido en un sobre sellado para que usted lo revise despus cuando le sea ms conveniente.   LO QUE PUEDE ESPERAR: Algunas sensaciones de hinchazn en el abdomen.  Puede tener ms gases de lo normal.  El caminar puede ayudarle a eliminar el aire que se le puso en el tracto gastrointestinal durante el procedimiento y reducir la hinchazn.  Si le hicieron una endoscopia inferior (como una colonoscopia o una sigmoidoscopia flexible), podra notar manchas de sangre en las heces fecales o en el papel higinico.  Si se someti a una preparacin intestinal para su procedimiento, es posible que no tenga una evacuacin intestinal normal durante [employer].   Tenga en cuenta:  Es posible que note un poco de irritacin y congestin en la nariz o algn drenaje.  Esto es debido al oxgeno [employer] durante su procedimiento.  No hay que preocuparse y esto debe desaparecer ms o Regulatory affairs officer.   SNTOMAS PARA REPORTAR INMEDIATAMENTE:  Despus de una endoscopia inferior (colonoscopia o sigmoidoscopia flexible):  Cantidades excesivas de sangre en las heces fecales  Sensibilidad significativa o empeoramiento de los dolores abdominales   Hinchazn aguda del abdomen que antes no tena   Fiebre de 100F o ms   Despus de la endoscopia superior (EGD)  Vmitos de Retail buyer o material como caf molido   Dolor en  el pecho o dolor debajo de los omplatos que antes no tena   Dolor o dificultad persistente para tragar  Falta de aire que antes no tena   Fiebre de 100F o ms  Heces fecales negras y pegajosas   Para asuntos urgentes o de Associate Professor, puede comunicarse con un gastroenterlogo a cualquier hora llamando al [phone].  DIETA:  Recomendamos una comida pequea al principio, pero luego puede continuar con su dieta normal.  Tome muchos lquidos, Tax adviser las bebidas alcohlicas durante 24 horas.    ACTIVIDAD:  Debe planear tomarse las cosas con calma por el resto del da y no debe CONDUCIR ni usar maquinaria pesada Patent examiner (debido a los medicamentos de sedacin utilizados durante el examen).     SEGUIMIENTO: Nuestro personal llamar al nmero que aparece en su historial al siguiente da hbil de su procedimiento para ver cmo se siente y para responder cualquier pregunta o inquietud que pueda tener con respecto a la informacin que se le dio despus del procedimiento. Si no podemos contactarle, le dejaremos un mensaje.  Sin embargo, si se siente bien y no tiene English as a second language teacher, no es necesario que nos devuelva la llamada.  Asumiremos que ha regresado a sus actividades diarias normales sin incidentes. Si se le tomaron algunas biopsias, le contactaremos por telfono o por carta en las prximas 3 semanas.  Si no ha sabido [employer] biopsias en el transcurso de 3 semanas, por favor llmenos al [phone].   FIRMAS/CONFIDENCIALIDAD: Daphane Shepherd y/o el acompaante que  le cuide han firmado documentos que se ingresarn en su historial mdico electrnico.  Estas firmas atestiguan el hecho de que la informacin anterior

## 2019-10-02 NOTE — Op Note (Signed)
[facility] Patient Name: Peggy Monroe Procedure Date: 10/02/2019 1:21 PM MRN: [id number] Endoscopist: Tressia Danas MD, MD Age: 39 Referring MD:  Date of Birth: 1980/12/15 Gender: Female Account #: [id number] Procedure:                Colonoscopy Indications:              Screening in patient at increased risk: Family                            history of 1st-degree relative with colorectal                            cancer before age 26 years                           Family history of colon cancer (father in his 26s,                            paternal cousin) Medicines:                Monitored Anesthesia Care Procedure:                Pre-Anesthesia Assessment:                           - Prior to the procedure, a History and Physical                            was performed, and patient medications and                            allergies were reviewed. The patient's tolerance of                            previous anesthesia was also reviewed. The risks                            and benefits of the procedure and the sedation                            options and risks were discussed with the patient.                            All questions were answered, and informed consent                            was obtained. Prior Anticoagulants: The patient has                            taken no previous anticoagulant or antiplatelet                            agents. ASA Grade Assessment: II - A patient with  mild systemic disease. After reviewing the risks                            and benefits, the patient was deemed in                            satisfactory condition to undergo the procedure.                           After obtaining informed consent, the colonoscope                            was passed under direct vision. Throughout the                            procedure, the patient's blood pressure, pulse, and                     oxygen saturations were monitored continuously. The                            Colonoscope was introduced through the anus and                            advanced to the 5 cm into the ileum. A second                            forward view of the right colon was performed. The                            colonoscopy was performed without difficulty. The                            patient tolerated the procedure well. The quality                            of the bowel preparation was good. The terminal                            ileum, ileocecal valve, appendiceal orifice, and                            rectum were photographed. Scope In: 1:46:17 PM Scope Out: 1:57:18 PM Scope Withdrawal Time: 0 hours 7 minutes 24 seconds  Total Procedure Duration: 0 hours 11 minutes 1 second  Findings:                 Small, non-bleeding hemorrhoids were found on                            retroflexed view of the rectum.                           The colon (entire examined portion) appeared normal.  The terminal ileum appeared normal.                           The exam was otherwise without abnormality on                            direct and retroflexion views. Complications:            No immediate complications. Estimated blood loss:                            Minimal. Estimated Blood Loss:     Estimated blood loss was minimal. Impression:               - Internal hemorrhoids.                           - The entire examined colon is normal.                           - The examined portion of the ileum was normal.                           - The examination was otherwise normal on direct                            and retroflexion views.                           - No specimens collected. Recommendation:           - Patient has a contact number available for                            emergencies. The signs and symptoms of potential                             delayed complications were discussed with the                            patient. Return to normal activities tomorrow.                            Written discharge instructions were provided to the                            patient.                           - Resume previous diet.                           - Continue present medications.                           - Repeat colonoscopy in 5 years for surveillance. Thornton Park MD, MD 10/02/2019 0:81:44 PM This report has been signed electronically.

## 2019-10-02 NOTE — Progress Notes (Signed)
Called to room to assist during endoscopic procedure.  Patient ID and intended procedure confirmed with present staff. Received instructions for my participation in the procedure from the performing physician.  

## 2019-10-06 ENCOUNTER — Telehealth: Payer: Self-pay

## 2019-10-06 NOTE — Telephone Encounter (Signed)
Covid-19 screening questions   Do you now or have you had a fever in the last 14 days? No.  Do you have any respiratory symptoms of shortness of breath or cough now or in the last 14 days? No. Do you have any family members or close contacts with diagnosed or suspected Covid-19 in the past 14 days? No. Have you been tested for Covid-19 and found to be positive? No.       Follow up Call-  Call back number 10/02/2019  Post procedure Call Back phone  # [phone]  Permission to leave phone message Yes  Some recent data might be hidden     Patient questions:  Do you have a fever, pain , or abdominal swelling? No. Pain Score  0 *  Have you tolerated food without any problems? Yes.    Have you been able to return to your normal activities? Yes.    Do you have any questions about your discharge instructions: Diet   No. Medications  No. Follow up visit  No.  Do you have questions or concerns about your Care? No.  Actions: * If pain score is 4 or above: No action needed, pain <4.

## 2019-11-25 ENCOUNTER — Ambulatory Visit: Payer: Self-pay | Admitting: Gastroenterology

## 2019-12-15 ENCOUNTER — Other Ambulatory Visit: Payer: Self-pay

## 2019-12-15 ENCOUNTER — Ambulatory Visit: Payer: Self-pay | Attending: Internal Medicine | Admitting: Internal Medicine

## 2019-12-15 NOTE — Progress Notes (Signed)
Name and DOB verified  C /o BL  burning sensation on her feet  with  pain and numbness sensation below the knee.States BL numbness and pain on her fingertips, with all over the body itchiness  Stated has been taking vitamin D3 OTC Need refills on anxiety meds

## 2019-12-15 NOTE — Progress Notes (Signed)
Patient was scheduled for a telephone visit.  She did not answer her cell phone.  We tried calling the home phone and someone answered and started talking but then hung up the phone.  We left a message on the cell phone telling her to call back to reschedule. I used Mauricio from [employer] [phone]).

## 2019-12-16 ENCOUNTER — Emergency Department (HOSPITAL_COMMUNITY)
Admission: EM | Admit: 2019-12-16 | Discharge: 2019-12-18 | Disposition: A | Discharge status: Home / Self Care | Payer: Self-pay | Attending: Emergency Medicine | Admitting: Emergency Medicine

## 2019-12-16 ENCOUNTER — Encounter (HOSPITAL_COMMUNITY): Payer: Self-pay

## 2019-12-16 DIAGNOSIS — G43809 Other migraine, not intractable, without status migrainosus: Secondary | ICD-10-CM | POA: Insufficient documentation

## 2019-12-16 DIAGNOSIS — J45909 Unspecified asthma, uncomplicated: Secondary | ICD-10-CM | POA: Insufficient documentation

## 2019-12-16 DIAGNOSIS — Z79899 Other long term (current) drug therapy: Secondary | ICD-10-CM | POA: Insufficient documentation

## 2019-12-16 NOTE — ED Triage Notes (Signed)
Pt states that she has been having a headache for the past 2 days, hx of migraines, and bilateral leg pain, denies injury

## 2019-12-17 LAB — PREGNANCY, URINE: Preg Test, Ur: NEGATIVE

## 2019-12-17 MED ORDER — ACETAMINOPHEN 325 MG PO TABS
650.0000 mg | ORAL_TABLET | Freq: Once | ORAL | Status: AC
  Administered 2019-12-17: 650 mg via ORAL
  Filled 2019-12-17: qty 2

## 2019-12-17 MED ORDER — METOCLOPRAMIDE HCL 5 MG/ML IJ SOLN
10.0000 mg | Freq: Once | INTRAMUSCULAR | Status: AC
  Administered 2019-12-17: 10 mg via INTRAVENOUS
  Filled 2019-12-17: qty 2

## 2019-12-17 MED ORDER — DIPHENHYDRAMINE HCL 50 MG/ML IJ SOLN
25.0000 mg | Freq: Once | INTRAMUSCULAR | Status: AC
  Administered 2019-12-17: 25 mg via INTRAVENOUS
  Filled 2019-12-17: qty 1

## 2019-12-17 MED ORDER — SODIUM CHLORIDE 0.9 % IV BOLUS
1000.0000 mL | Freq: Once | INTRAVENOUS | Status: AC
  Administered 2019-12-17: 1000 mL via INTRAVENOUS

## 2019-12-17 NOTE — ED Notes (Signed)
Pt states headache is completely gone.  States she is so relieved because she has had pain for 3 days.

## 2019-12-17 NOTE — ED Notes (Signed)
Walked patient to the bathroom patient did well 

## 2019-12-17 NOTE — ED Provider Notes (Signed)
[facility] EMERGENCY DEPARTMENT Provider Note   CSN: [id number] Arrival date & time: 12/16/19  2257     History No chief complaint on file.   Oaklyn Jakubek is a 39 y.o. female with a past medical history of asthma presenting to the ED with a chief complaint of headache and body aches.  For the past 3 days has been experiencing aching pain in her arms and legs as well as an intermittent headache.  Symptoms only minimally improved with Tylenol which she last took yesterday.  She denies any sick contacts with similar symptoms, injuries or falls.  She states that she has a history of migraines.  She denies any known Covid exposures, blurry vision, numbness in arms or legs, chest pain, abdominal pain, extremity weakness, injuries or falls, neck stiffness or fever.  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     Family History  Problem Relation Age of Onset   Colon cancer Father    Diabetes Maternal Grandmother    Diabetes Paternal Grandmother    Esophageal cancer Neg Hx    Rectal cancer Neg Hx    Stomach cancer Neg Hx     Social History   Tobacco Use   Smoking status: Never Smoker   Smokeless tobacco: Never Used  Vaping Use   Vaping Use: Never  used  Substance Use Topics   Alcohol use: No   Drug use: No    Home Medications Prior to Admission medications   Medication Sig Start Date End Date Taking? Authorizing Provider  albuterol (PROVENTIL HFA;VENTOLIN HFA) 108 (90 BASE) MCG/ACT inhaler Inhale 2 puffs into the lungs every 6 (six) hours as needed. Shortness of breath    [provider]  cyclobenzaprine (FLEXERIL) 10 MG tablet Take 1 tablet (10 mg total) by mouth 2 (two) times daily as needed for muscle spasms. Patient not taking: Reported on 10/02/2019 09/26/15   Elpidio Anis, PA-C  ibuprofen (ADVIL,MOTRIN) 200 MG tablet Take 400 mg by mouth every 6 (six) hours as needed for moderate pain.     [provider]  omeprazole (PRILOSEC) 40 MG capsule Take 1 capsule (40 mg total) by mouth 2 (two) times daily. 09/01/19   Tressia Danas, MD  prochlorperazine (COMPAZINE) 10 MG tablet Take 1 tablet (10 mg total) by mouth every 8 (eight) hours as needed for nausea or vomiting (headache). Patient not taking: Reported on 10/02/2019 09/10/19   Cain Saupe, MD  traZODone (DESYREL) 50 MG tablet Take 0.5-1 tablets (25-50 mg total) by mouth at bedtime as needed for sleep. Patient not taking: Reported on 10/02/2019 07/23/19   Cain Saupe, MD    Allergies    Patient  has no known allergies.  Review of Systems   Review of Systems  Constitutional: Negative for appetite change, chills and fever.  HENT: Negative for ear pain, rhinorrhea, sneezing and sore throat.   Eyes: Negative for photophobia and visual disturbance.  Respiratory: Negative for cough, chest tightness, shortness of breath and wheezing.   Cardiovascular: Negative for chest pain and palpitations.  Gastrointestinal: Negative for abdominal pain, blood in stool, constipation, diarrhea, nausea and vomiting.  Genitourinary: Negative for dysuria, hematuria and urgency.  Musculoskeletal: Positive for myalgias.  Skin: Negative for rash.  Neurological: Positive for  headaches. Negative for dizziness, weakness and light-headedness.    Physical Exam Updated Vital Signs BP 107/65    Pulse 78    Temp 98.7 F (37.1 C) (Oral)    Resp 14    SpO2 100%   Physical Exam Vitals and nursing note reviewed.  Constitutional:      General: She is not in acute distress.    Appearance: She is well-developed.  HENT:     Head: Normocephalic and atraumatic.     Nose: Nose normal.  Eyes:     General: No scleral icterus.       Right eye: No discharge.        Left eye: No discharge.     Conjunctiva/sclera: Conjunctivae normal.     Pupils: Pupils are equal, round, and reactive to light.  Cardiovascular:     Rate and Rhythm: Normal rate and regular rhythm.     Heart sounds: Normal heart sounds. No murmur heard.  No friction rub. No gallop.   Pulmonary:     Effort: Pulmonary effort is normal. No respiratory distress.     Breath sounds: Normal breath sounds.  Abdominal:     General: Bowel sounds are normal. There is no distension.     Palpations: Abdomen is soft.     Tenderness: There is no abdominal tenderness. There is no guarding.  Musculoskeletal:        General: Normal range of motion.     Cervical back: Normal range of motion and neck supple.  Skin:    General: Skin is warm and dry.     Findings: No rash.  Neurological:     General: No focal deficit present.     Mental Status: She is alert and oriented to person, place, and time.     Cranial Nerves: No cranial nerve deficit.     Sensory: No sensory deficit.     Motor: No weakness or abnormal muscle tone.     Coordination: Coordination normal.     Comments: Pupils reactive. No facial asymmetry noted. Cranial nerves appear grossly intact. Sensation intact to light touch on face, BUE and BLE. Strength 5/5 in BUE and BLE.      ED Results / Procedures / Treatments   Labs (all labs ordered are listed, but only abnormal results are displayed) Labs Reviewed  PREGNANCY, URINE     EKG None  Radiology No results found.  Procedures Procedures (including critical care time)  Medications Ordered in ED Medications  sodium chloride 0.9 % bolus 1,000 mL (1,000 mLs Intravenous New Bag/Given 12/17/19 1056)  metoCLOPramide (REGLAN) injection 10 mg (10 mg Intravenous Given 12/17/19 1057)  diphenhydrAMINE (BENADRYL) injection 25 mg (25 mg Intravenous Given 12/17/19 1056)  acetaminophen (TYLENOL) tablet 650 mg (650 mg Oral Given 12/17/19 1051)    ED Course  I have reviewed the triage vital signs and the nursing notes.  Pertinent labs & imaging results  that were available during my care of the patient were reviewed by me and considered in my medical decision making (see chart for details).    MDM Rules/Calculators/A&P                          39 year old female presenting to the ED with a chief complaint of headache and body aches.  Has a history of migraines.  No neurological deficits noted on exam.  No neck stiffness or meningeal signs noted.  She is afebrile without recent use of antipyretics.  Pregnancy test is negative.  Patient given IV fluids and migraine cocktail with significant improvement in her symptoms.  She denies any respiratory or GI symptoms.  She does not want to be tested for Covid. There are no headache characteristics that are lateralizing or concerning for increased ICP, infectious or vascular cause of her symptoms.  She is requesting discharge as she reports significant improvement.  Suspect that symptoms are due to her usual migraine versus other possible viral illness.  We will have her continue home medications, Tylenol and ibuprofen as needed and increase hydration.  Patient is hemodynamically stable, in NAD, and able to ambulate in the ED. Evaluation does not show pathology that would require ongoing emergent intervention or inpatient treatment. I explained the diagnosis to the patient. Pain has been managed and has no complaints prior to discharge.  Patient is comfortable with above plan and is stable for discharge at this time. All questions were answered prior to disposition. Strict return precautions for returning to the ED were discussed. Encouraged follow up with PCP.   An After Visit Summary was printed and given to the patient.   Portions of this note were generated with Scientist, clinical (histocompatibility and immunogenetics). Dictation errors may occur despite best attempts at proofreading.  Final Clinical Impression(s) / ED Diagnoses Final diagnoses:  Other migraine without status migrainosus, not intractable    Rx / DC Orders ED Discharge Orders    None       Dietrich Pates, PA-C 12/17/19 1204    Alvira Monday, MD 12/18/19 2127

## 2019-12-17 NOTE — Discharge Instructions (Signed)
Continue your home medications as previously prescribed. .  Recommend plenty of fluids to stay hydrated. Follow-up with your primary care provider. Return to the ER for worsening headache, blurry vision, numbness in arms or legs, neck stiffness or fever.

## 2019-12-25 ENCOUNTER — Ambulatory Visit: Payer: Self-pay

## 2020-01-21 ENCOUNTER — Encounter: Payer: Self-pay | Admitting: Physician Assistant

## 2020-01-21 ENCOUNTER — Ambulatory Visit: Payer: Self-pay | Attending: Physician Assistant | Admitting: Physician Assistant

## 2020-01-21 ENCOUNTER — Other Ambulatory Visit: Payer: Self-pay

## 2020-01-21 VITALS — BP 110/76 | HR 70 | Resp 16 | Wt 164.4 lb

## 2020-01-21 DIAGNOSIS — R200 Anesthesia of skin: Secondary | ICD-10-CM

## 2020-01-21 DIAGNOSIS — M79605 Pain in left leg: Secondary | ICD-10-CM

## 2020-01-21 DIAGNOSIS — Z789 Other specified health status: Secondary | ICD-10-CM

## 2020-01-21 DIAGNOSIS — Z13220 Encounter for screening for lipoid disorders: Secondary | ICD-10-CM

## 2020-01-21 DIAGNOSIS — E559 Vitamin D deficiency, unspecified: Secondary | ICD-10-CM

## 2020-01-21 DIAGNOSIS — R202 Paresthesia of skin: Secondary | ICD-10-CM

## 2020-01-21 DIAGNOSIS — M79604 Pain in right leg: Secondary | ICD-10-CM

## 2020-01-21 MED ORDER — NAPROXEN 500 MG PO TABS: 500.0000 mg | ORAL_TABLET | Freq: Two times a day (BID) | ORAL | 1 refills | Status: DC

## 2020-01-21 MED ORDER — GABAPENTIN 300 MG PO CAPS: 300.0000 mg | ORAL_CAPSULE | Freq: Every day | ORAL | 5 refills | Status: DC

## 2020-01-21 MED FILL — NAPROXEN 500 MG TABLET: 500 | 30 days supply | Qty: 60 | Fill #0

## 2020-01-21 MED FILL — GABAPENTIN 300 MG CAPSULE: 300 | 30 days supply | Qty: 30 | Fill #0

## 2020-01-21 NOTE — Progress Notes (Signed)
Peggy Monroe, is a 39 y.o. female  XIP:[id number]  QBH:[id number]  DOB - June 06, 1981  Subjective:  Chief Complaint and HPI: Peggy Monroe is a 39 y.o. female here today for B leg and foot pain with occasional numbness in hands and feet.  Her symptoms are constant.  Not worse or better by anything.  NKI.  This is not a new problem-she has seen a doctor for this in the past.  About 6 months ago, she says she was told her vitamin D was low and she took vitamin D and the symptoms went away.  Symptoms returned about 1 month ago. Pain is moderate.    Reviewed labs-A1C in March WNL.  Not diabetic.    She would like to get her cholesterol checked.    Aerel with [employer] interpreters translating    ROS:   Constitutional:  No f/c, No night sweats, No unexplained weight loss. EENT:  No vision changes, No blurry vision, No hearing changes. No mouth, throat, or ear problems.  Respiratory: No cough, No SOB Cardiac: No CP, no palpitations GI:  No abd pain, No N/V/D. GU: No Urinary s/sx Musculoskeletal: see above Neuro: No headache, occasional dizziness, no motor weakness.  Skin: No rash Endocrine:  No polydipsia. No polyuria.  Psych: Denies SI/HI  No problems updated.  ALLERGIES: No Known Allergies  PAST MEDICAL HISTORY: Past Medical History:  Diagnosis Date  . Asthma   . HLD (hyperlipidemia)   . Recurrent upper respiratory infection (URI)   . Shortness of breath    " with my asthma"    MEDICATIONS AT HOME: Prior to Admission medications   Medication Sig Start Date End Date Taking? Authorizing Provider  albuterol (PROVENTIL HFA;VENTOLIN HFA) 108 (90 BASE) MCG/ACT inhaler Inhale 2 puffs into the lungs every 6 (six) hours as needed. Shortness of breath    [provider]  cyclobenzaprine (FLEXERIL) 10 MG tablet Take 1 tablet (10 mg total) by mouth 2 (two) times daily as needed for muscle spasms. Patient not taking: Reported on 10/02/2019 09/26/15   Elpidio Anis, PA-C  gabapentin (NEURONTIN) 300 MG capsule Take 1 capsule (300 mg total) by mouth at bedtime. Prn numbness 01/21/20   Anders Simmonds, PA-C  ibuprofen (ADVIL,MOTRIN) 200 MG tablet Take 400 mg by mouth every 6 (six) hours as needed for moderate pain.     [provider]  naproxen (NAPROSYN) 500 MG tablet Take 1 tablet (500 mg total) by mouth 2 (two) times daily with a meal. Prn pain 01/21/20   Anders Simmonds, PA-C  omeprazole (PRILOSEC) 40 MG capsule Take 1 capsule (40 mg total) by mouth 2 (two) times daily. 09/01/19   Tressia Danas, MD  prochlorperazine (COMPAZINE) 10 MG tablet Take 1 tablet (10 mg total) by mouth every 8 (eight) hours as needed for nausea or vomiting (headache). Patient not taking: Reported on 10/02/2019 09/10/19   Cain Saupe, MD  traZODone (DESYREL) 50 MG tablet Take 0.5-1 tablets (25-50 mg total) by mouth at bedtime as needed for sleep. Patient not taking: Reported on 10/02/2019 07/23/19   Fulp, Hewitt Shorts, MD     Objective:  EXAM:   Vitals:   01/21/20 0924  BP: 110/76  Pulse: 70  Resp: 16  SpO2: 96%  Weight: 164 lb 6.4 oz (74.6 kg)    General appearance : A&OX3. NAD. Non-toxic-appearing HEENT: Atraumatic and Normocephalic.  PERRLA. EOM intact.  TNeck: supple, no JVD. No cervical lymphadenopathy. No thyromegaly Chest/Lungs:  Breathing-non-labored, Good air  entry bilaterally, breath sounds normal without rales, rhonchi, or wheezing  CVS: S1 S2 regular, no murmurs, gallops, rubs  Extremities: Bilateral Lower Ext shows no edema, both legs are warm to touch with = pulse throughout.  Normal strength of BLE and no gross abnormalities.   Neurology:  CN II-XII grossly intact, Non focal.   Psych:  TP linear. J/I WNL. Normal speech. Appropriate eye contact and affect.  Skin:  No Rash  Data Review Lab Results  Component Value Date   HGBA1C 5.4 09/10/2019     Assessment & Plan   1. Numbness and tingling of both feet - TSH - gabapentin (NEURONTIN) 300  MG capsule; Take 1 capsule (300 mg total) by mouth at bedtime. Prn numbness  Dispense: 30 capsule; Refill: 5  2. Leg pain, bilateral - naproxen (NAPROSYN) 500 MG tablet; Take 1 tablet (500 mg total) by mouth 2 (two) times daily with a meal. Prn pain  Dispense: 60 tablet; Refill: 1  3. Screening cholesterol level - Lipid panel  4. Vitamin D deficiency - Vitamin D, 25-hydroxy  5. Language barrier [employer] interpreters used and additional time performing visit was required.   Patient have been counseled extensively about nutrition and exercise  Return in about 3 months (around 04/22/2020) for PCP;  leg and foot pain.  The patient was given clear instructions to go to ER or return to medical center if symptoms don't improve, worsen or new problems develop. The patient verbalized understanding. The patient was told to call to get lab results if they haven't heard anything in the next week.     Georgian Co, PA-C [facility] and [facility] [place], [place] [phone]   01/21/2020, 9:36 AMPatient ID: Buddy Duty, female   DOB: 12/01/1980, 39 y.o.   MRN: [id number]

## 2020-01-22 ENCOUNTER — Other Ambulatory Visit: Payer: Self-pay | Admitting: Physician Assistant

## 2020-01-22 DIAGNOSIS — E559 Vitamin D deficiency, unspecified: Secondary | ICD-10-CM

## 2020-01-22 DIAGNOSIS — E7800 Pure hypercholesterolemia, unspecified: Secondary | ICD-10-CM

## 2020-01-22 LAB — LIPID PANEL
Chol/HDL Ratio: 4.7 ratio — ABNORMAL HIGH (ref 0.0–4.4)
Cholesterol, Total: 211 mg/dL — ABNORMAL HIGH (ref 100–199)
HDL: 45 mg/dL (ref >39)
LDL Chol Calc (NIH): 138 mg/dL — ABNORMAL HIGH (ref 0–99)
Triglycerides: 156 mg/dL — ABNORMAL HIGH (ref 0–149)
VLDL Cholesterol Cal: 28 mg/dL (ref 5–40)

## 2020-01-22 LAB — VITAMIN D 25 HYDROXY (VIT D DEFICIENCY, FRACTURES): Vit D, 25-Hydroxy: 14.4 ng/mL — ABNORMAL LOW (ref 30.0–100.0)

## 2020-01-22 LAB — TSH: TSH: 1.38 u[IU]/mL (ref 0.450–4.500)

## 2020-01-22 MED ORDER — ATORVASTATIN CALCIUM 10 MG PO TABS: 10.0000 mg | ORAL_TABLET | Freq: Every day | ORAL | 3 refills | Status: DC

## 2020-01-22 MED ORDER — VITAMIN D (ERGOCALCIFEROL) 1.25 MG (50000 UNIT) PO CAPS: 50000.0000 [IU] | ORAL_CAPSULE | ORAL | 0 refills | Status: DC

## 2020-02-06 MED FILL — NAPROXEN 500 MG TABLET: 500 | 30 days supply | Qty: 60 | Fill #0

## 2020-02-06 MED FILL — PROCHLORPERAZINE 10 MG TAB: 10 | 3 days supply | Qty: 10 | Fill #1

## 2020-02-06 MED FILL — GABAPENTIN 300 MG CAPSULE: 300 | 30 days supply | Qty: 30 | Fill #0

## 2020-04-15 ENCOUNTER — Other Ambulatory Visit: Payer: Self-pay

## 2020-04-15 ENCOUNTER — Ambulatory Visit: Payer: Self-pay | Attending: Family Medicine

## 2020-05-04 ENCOUNTER — Telehealth: Payer: Self-pay | Admitting: Family Medicine

## 2020-05-04 NOTE — Telephone Encounter (Signed)
Pt was sent a letter from financial dept. Inform them, that the application they submitted was incomplete, since they were missing some documentation at the time of the appointment, Pt need to reschedule and resubmit all new papers and application for [employer] and [employer], P.S. old documents has been sent back by mail to the Pt and Pt. need to make a new appt. 

## 2020-05-31 ENCOUNTER — Ambulatory Visit: Payer: Self-pay

## 2020-05-31 NOTE — Telephone Encounter (Signed)
Using [employer] [phone]. Patient called and says she's been dizzy for the past 4 days. She says the room spins a little. She says when she's up walking, she feels like she will faint, but can walk normal. She denies recent illness. She says she has nausea and a little blurry vision. I advised no availability in the office, so go to the UC since she reports feeling faint. Patient verbalized understanding.  Reason for Disposition . [1] MODERATE dizziness (e.g., interferes with normal activities) AND [2] has NOT been evaluated by physician for this  (Exception: dizziness caused by heat exposure, sudden standing, or poor fluid intake)  Answer Assessment - Initial Assessment Questions 1. DESCRIPTION: "Describe your dizziness."     Vertigo 2. LIGHTHEADED: "Do you feel lightheaded?" (e.g., somewhat faint, woozy, weak upon standing)     Yes 3. VERTIGO: "Do you feel like either you or the room is spinning or tilting?" (i.e. vertigo)     A little 4. SEVERITY: "How bad is it?"  "Do you feel like you are going to faint?" "Can you stand and walk?"   - MILD: Feels slightly dizzy, but walking normally.   - MODERATE: Feels very unsteady when walking, but not falling; interferes with normal activities (e.g., school, work) .   - SEVERE: Unable to walk without falling, or requires assistance to walk without falling; feels like passing out now.      Moderate 5. ONSET:  "When did the dizziness begin?"     4 days ago 6. AGGRAVATING FACTORS: "Does anything make it worse?" (e.g., standing, change in head position)     Walking 7. HEART RATE: "Can you tell me your heart rate?" "How many beats in 15 seconds?"  (Note: not all patients can do this)       No 8. CAUSE: "What do you think is causing the dizziness?"     No idea 9. RECURRENT SYMPTOM: "Have you had dizziness before?" If Yes, ask: "When was the last time?" "What happened that time?"     Yes, vitamin D low and cholesterol high 10. OTHER  SYMPTOMS: "Do you have any other symptoms?" (e.g., fever, chest pain, vomiting, diarrhea, bleeding)      Nausea, little blurred vision 11. PREGNANCY: "Is there any chance you are pregnant?" "When was your last menstrual period?"       No, LMP yesterday  Protocols used: DIZZINESS [employer]

## 2020-06-01 NOTE — Telephone Encounter (Signed)
Please schedule pt with any available provider

## 2020-07-20 ENCOUNTER — Emergency Department (HOSPITAL_COMMUNITY)
Admission: EM | Admit: 2020-07-20 | Discharge: 2020-07-20 | Disposition: A | Discharge status: Home / Self Care | Payer: Self-pay | Attending: Emergency Medicine | Admitting: Emergency Medicine

## 2020-07-20 ENCOUNTER — Emergency Department (HOSPITAL_COMMUNITY): Payer: Self-pay

## 2020-07-20 ENCOUNTER — Telehealth: Payer: Self-pay

## 2020-07-20 ENCOUNTER — Encounter (HOSPITAL_COMMUNITY): Payer: Self-pay | Admitting: Emergency Medicine

## 2020-07-20 DIAGNOSIS — R072 Precordial pain: Secondary | ICD-10-CM | POA: Insufficient documentation

## 2020-07-20 DIAGNOSIS — R0602 Shortness of breath: Secondary | ICD-10-CM | POA: Insufficient documentation

## 2020-07-20 DIAGNOSIS — R0789 Other chest pain: Secondary | ICD-10-CM

## 2020-07-20 DIAGNOSIS — J45909 Unspecified asthma, uncomplicated: Secondary | ICD-10-CM | POA: Insufficient documentation

## 2020-07-20 DIAGNOSIS — J4541 Moderate persistent asthma with (acute) exacerbation: Secondary | ICD-10-CM

## 2020-07-20 LAB — CBC
HCT: 36.5 % (ref 36.0–46.0)
Hemoglobin: 12.3 g/dL (ref 12.0–15.0)
MCH: 32 pg (ref 26.0–34.0)
MCHC: 33.7 g/dL (ref 30.0–36.0)
MCV: 95.1 fL (ref 80.0–100.0)
Platelets: 262 10*3/uL (ref 150–400)
RBC: 3.84 MIL/uL — ABNORMAL LOW (ref 3.87–5.11)
RDW: 13.2 % (ref 11.5–15.5)
WBC: 7.2 10*3/uL (ref 4.0–10.5)
nRBC: 0 % (ref 0.0–0.2)

## 2020-07-20 LAB — TROPONIN I (HIGH SENSITIVITY)
Troponin I (High Sensitivity): <2 ng/L (ref <18)
Troponin I (High Sensitivity): <2 ng/L (ref <18)

## 2020-07-20 LAB — BASIC METABOLIC PANEL
Anion gap: 9 (ref 5–15)
BUN: 14 mg/dL (ref 6–20)
CO2: 23 mmol/L (ref 22–32)
Calcium: 9.1 mg/dL (ref 8.9–10.3)
Chloride: 106 mmol/L (ref 98–111)
Creatinine, Ser: 0.62 mg/dL (ref 0.44–1.00)
GFR, Estimated: >60 mL/min (ref >60)
Glucose, Bld: 87 mg/dL (ref 70–99)
Potassium: 3.9 mmol/L (ref 3.5–5.1)
Sodium: 138 mmol/L (ref 135–145)

## 2020-07-20 LAB — I-STAT BETA HCG BLOOD, ED (MC, WL, AP ONLY): I-stat hCG, quantitative: <5 m[IU]/mL (ref <5)

## 2020-07-20 MED ORDER — PREDNISONE 20 MG PO TABS
20.0000 mg | ORAL_TABLET | Freq: Once | ORAL | Status: AC
  Administered 2020-07-20: 20 mg via ORAL
  Filled 2020-07-20: qty 1

## 2020-07-20 MED ORDER — PREDNISONE 10 MG PO TABS
20.0000 mg | ORAL_TABLET | Freq: Every day | ORAL | 0 refills | Status: DC
Start: 2020-07-20 — End: 2021-08-09

## 2020-07-20 MED ORDER — ALBUTEROL SULFATE HFA 108 (90 BASE) MCG/ACT IN AERS: 2.0000 | INHALATION_SPRAY | Freq: Four times a day (QID) | RESPIRATORY_TRACT | 0 refills | Status: DC | PRN

## 2020-07-20 NOTE — Discharge Instructions (Addendum)
Begin taking prednisone as prescribed.  Continue use of your albuterol inhaler, 2 puffs every 4 hours as needed.  Follow-up with primary doctor if not improving in the next few days, and return to the emergency department if symptoms significantly worsen or change.

## 2020-07-20 NOTE — Addendum Note (Signed)
Addended by: Jonah Blue B on: 07/20/2020 04:23 PM   Modules accepted: Orders

## 2020-07-20 NOTE — Telephone Encounter (Signed)
Will forward to provider  

## 2020-07-20 NOTE — ED Provider Notes (Signed)
[facility] EMERGENCY DEPARTMENT Provider Note   CSN: [id number] Arrival date & time: 07/20/20  1014     History Chief Complaint  Patient presents with  . Shortness of Breath  . Chest Pain    Peggy Monroe is a 40 y.o. female.  Patient is a 40 year old female with past medical history of asthma, hyperlipidemia, anxiety.  Patient presents today for evaluation of shortness of breath, tightness in her chest and palpitations.  Patient reports a several day history of shortness of breath.  Her albuterol inhaler prescription was refilled.  Patient has been using this, then yesterday evening developed what she describes as spasms in the center of her chest.  She also describes rapid heart rate during this episode.  Patient denies nausea, diaphoresis, radiation to the arm or jaw.  Patient denies any recent exertional symptoms.  She denies fevers, chills, or cough.  She denies any leg pain or swelling.  The history is provided by the patient.  Chest Pain Pain location:  Substernal area Pain quality: sharp   Pain radiates to:  Does not radiate Pain severity:  Moderate Onset quality:  Sudden Timing:  Constant Progression:  Resolved Chronicity:  New Relieved by:  Nothing Worsened by:  Nothing      Past Medical History:  Diagnosis Date  . Asthma   . HLD (hyperlipidemia)   . Recurrent upper respiratory infection (URI)   . Shortness of breath    " with my asthma"    Patient Active Problem List   Diagnosis Date Noted  . EAR PAIN, LEFT 08/12/2009  . ANXIETY DEPRESSION 07/15/2009  . ALLERGIC RHINITIS 06/02/2009  . 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 OTITIS MEDIA, LEFT 04/02/2009  . Headache 04/02/2009  . CHRONIC TENSION TYPE HEADACHE 02/03/2009  . INSOMNIA UNSPECIFIED 02/03/2009  . HYPERCHOLESTEROLEMIA 11/20/2008  . LABYRINTHITIS 10/29/2008    Past Surgical History:  Procedure Laterality Date  . CESAREAN SECTION    . ELBOW SURGERY Left At age 17   . TONSILLECTOMY       OB History   No obstetric history on file.     Family History  Problem Relation Age of Onset  . Colon cancer Father   . Diabetes Maternal Grandmother   . Diabetes Paternal Grandmother   . Esophageal cancer Neg Hx   . Rectal cancer Neg Hx   . Stomach cancer Neg Hx     Social History   Tobacco Use  . Smoking status: Never Smoker  . Smokeless tobacco: Never Used  Vaping Use  . Vaping Use: Never used  Substance Use Topics  . Alcohol use: No  . Drug use: No    Home Medications Prior to Admission medications   Medication Sig Start Date End Date Taking? Authorizing Provider  albuterol (VENTOLIN HFA) 108 (90 Base) MCG/ACT inhaler Inhale 2 puffs into the lungs every 6 (six) hours as needed. Shortness of breath 07/20/20   Marcine Matar, MD  atorvastatin (LIPITOR) 10 MG tablet Take 1 tablet (10 mg total) by mouth daily. 01/22/20   Anders Simmonds, PA-C  cyclobenzaprine (FLEXERIL) 10 MG tablet Take 1 tablet (10 mg total) by mouth 2 (two) times daily as needed for muscle spasms. Patient not taking: Reported on 10/02/2019 09/26/15   Elpidio Anis, PA-C  gabapentin (NEURONTIN) 300 MG capsule Take 1 capsule (300 mg total) by mouth at bedtime. Prn numbness 01/21/20   Anders Simmonds, PA-C  ibuprofen (ADVIL,MOTRIN) 200 MG tablet Take 400 mg  by mouth every 6 (six) hours as needed for moderate pain.     [provider]  naproxen (NAPROSYN) 500 MG tablet Take 1 tablet (500 mg total) by mouth 2 (two) times daily with a meal. Prn pain 01/21/20   Anders Simmonds, PA-C  omeprazole (PRILOSEC) 40 MG capsule Take 1 capsule (40 mg total) by mouth 2 (two) times daily. 09/01/19   Tressia Danas, MD  prochlorperazine (COMPAZINE) 10 MG tablet Take 1 tablet (10 mg total) by mouth every 8 (eight) hours as needed for nausea or vomiting (headache). Patient not taking: Reported on 10/02/2019 09/10/19   Cain Saupe, MD  traZODone (DESYREL) 50 MG tablet Take 0.5-1 tablets (25-50  mg total) by mouth at bedtime as needed for sleep. Patient not taking: Reported on 10/02/2019 07/23/19   Fulp, Hewitt Shorts, MD  Vitamin D, Ergocalciferol, (DRISDOL) 1.25 MG (50000 UNIT) CAPS capsule Take 1 capsule (50,000 Units total) by mouth every 7 (seven) days. 01/22/20   Anders Simmonds, PA-C    Allergies    Patient has no known allergies.  Review of Systems   Review of Systems  Cardiovascular: Positive for chest pain.  All other systems reviewed and are negative.   Physical Exam Updated Vital Signs BP (!) 111/56 (BP Location: Right Arm)   Pulse 69   Temp 98.8 F (37.1 C) (Oral)   Resp 18   LMP 06/21/2020 (Approximate)   SpO2 100%   Physical Exam Vitals and nursing note reviewed.  Constitutional:      General: She is not in acute distress.    Appearance: She is well-developed and well-nourished. She is not diaphoretic.  HENT:     Head: Normocephalic and atraumatic.  Cardiovascular:     Rate and Rhythm: Normal rate and regular rhythm.     Heart sounds: No murmur heard. No friction rub. No gallop.   Pulmonary:     Effort: Pulmonary effort is normal. No respiratory distress.     Breath sounds: Normal breath sounds. No wheezing.  Abdominal:     General: Bowel sounds are normal. There is no distension.     Palpations: Abdomen is soft.     Tenderness: There is no abdominal tenderness.  Musculoskeletal:        General: Normal range of motion.     Cervical back: Normal range of motion and neck supple.     Right lower leg: No tenderness. No edema.     Left lower leg: No tenderness. No edema.     Comments: Denna Haggard' sign is absent bilaterally.  Skin:    General: Skin is warm and dry.  Neurological:     Mental Status: She is alert and oriented to person, place, and time.     ED Results / Procedures / Treatments   Labs (all labs ordered are listed, but only abnormal results are displayed) Labs Reviewed  CBC - Abnormal; Notable for the following components:      Result Value    RBC 3.84 (*)    All other components within normal limits  BASIC METABOLIC PANEL  I-STAT BETA HCG BLOOD, ED ([facility], [facility], [facility] ONLY)  TROPONIN I (HIGH SENSITIVITY)  TROPONIN I (HIGH SENSITIVITY)    EKG EKG Interpretation  Date/Time:  Tuesday July 20 2020 10:21:59 EST Ventricular Rate:  77 PR Interval:  138 QRS Duration: 72 QT Interval:  384 QTC Calculation: 434 R Axis:   58 Text Interpretation: Normal sinus rhythm Normal ECG Confirmed by Geoffery Lyons ([id number]) on 07/20/2020  11:05:16 PM   Radiology DG Chest 2 View  Result Date: 07/20/2020 CLINICAL DATA:  Chest pain and shortness of breath. EXAM: CHEST - 2 VIEW COMPARISON:  07/14/2019 FINDINGS: The heart size and mediastinal contours are within normal limits. Both lungs are clear. Calcified granuloma noted in the left midlung. The visualized skeletal structures are unremarkable. IMPRESSION: No active cardiopulmonary disease. Electronically Signed   By: Signa Kell M.D.   On: 07/20/2020 10:40    Procedures Procedures   Medications Ordered in ED Medications  predniSONE (DELTASONE) tablet 20 mg (has no administration in time range)    ED Course  I have reviewed the triage vital signs and the nursing notes.  Pertinent labs & imaging results that were available during my care of the patient were reviewed by me and considered in my medical decision making (see chart for details).    MDM Rules/Calculators/A&P  Patient with history of asthma presenting with complaints of chest pain and difficulty breathing.  Patient's work-up today is unremarkable.  Her EKG is normal and troponin x2 is negative.  Chest x-ray is clear and her physical examination is unremarkable for any leg swelling, calf tenderness, rales, or wheezes.  Patient is in no respiratory distress with stable vital signs.  Oxygen saturations have been consistently 100% with heart rate in the 60s and 70s.  I highly doubt pulmonary embolism or cardiac etiology.  She appears  very comfortable.  Her symptoms yesterday evening may have been related to bronchospasm secondary to her asthma.  I will have her continue her inhaler and will do a course of prednisone.  She is to follow-up with primary doctor if not improving and return if symptoms worsen or change.  Final Clinical Impression(s) / ED Diagnoses Final diagnoses:  None    Rx / DC Orders ED Discharge Orders    None       Geoffery Lyons, MD 07/20/20 2339

## 2020-07-20 NOTE — ED Triage Notes (Signed)
Pt reports sob and chest pain that began last night. Denies any cough or fevers. resp e/u, nad.

## 2020-07-20 NOTE — ED Notes (Signed)
Discharge instructions provided to patient. Verbalized understanding. Alert and oriented. Ambulated with steady gait out of ED. 

## 2020-07-20 NOTE — Telephone Encounter (Signed)
Patient called in requesting refills of albuterol. Patient is a former [facility] patient and has upcoming visit on the 8th with [facility]. Please advise.

## 2020-07-21 ENCOUNTER — Other Ambulatory Visit: Payer: Self-pay | Admitting: Pharmacist

## 2020-07-21 MED ORDER — ALBUTEROL SULFATE HFA 108 (90 BASE) MCG/ACT IN AERS: 2.0000 | INHALATION_SPRAY | Freq: Four times a day (QID) | RESPIRATORY_TRACT | 0 refills | Status: DC | PRN

## 2020-07-21 MED FILL — ALBUTEROL SULFATE HFA 108 (: 108 (90 BAS | 25 days supply | Qty: 18 | Fill #0

## 2020-07-27 ENCOUNTER — Ambulatory Visit: Payer: Self-pay | Admitting: Internal Medicine

## 2021-01-05 ENCOUNTER — Encounter: Payer: Self-pay | Admitting: Physician Assistant

## 2021-01-05 ENCOUNTER — Other Ambulatory Visit: Payer: Self-pay

## 2021-01-05 ENCOUNTER — Ambulatory Visit: Payer: Self-pay | Attending: Physician Assistant | Admitting: Physician Assistant

## 2021-01-05 DIAGNOSIS — Z789 Other specified health status: Secondary | ICD-10-CM

## 2021-01-05 DIAGNOSIS — E559 Vitamin D deficiency, unspecified: Secondary | ICD-10-CM

## 2021-01-05 DIAGNOSIS — R42 Dizziness and giddiness: Secondary | ICD-10-CM

## 2021-01-05 DIAGNOSIS — N926 Irregular menstruation, unspecified: Secondary | ICD-10-CM

## 2021-01-05 NOTE — Progress Notes (Signed)
Patient ID: Peggy Monroe, female   DOB: 1980/10/13, 40 y.o.   MRN: [id number] Virtual Visit via Telephone Note  I connected with Peggy Monroe on 01/05/21 at  9:10 AM EDT by telephone and verified that I am speaking with the correct person using two identifiers.  Location: Patient: home Provider: [facility] office [facility] interpreter   I discussed the limitations, risks, security and privacy concerns of performing an evaluation and management service by telephone and the availability of in person appointments. I also discussed with the patient that there may be a patient responsible charge related to this service. The patient expressed understanding and agreed to proceed.   History of Present Illness:  patient c/o 5 month h/o of having 1-2 days of heavy bleeding at the outset of her period and then proceeds to spot for 2-3 weeks.  Periods occurring monthly, so it's almost like she never stops bleeding.  Not SA/single.  No fever.  No pelvic pain.  No N/V/D.  She has some dizziness intermittently on heavier days.  Admits to poor water intake.      Observations/Objective:  NAD.  A&Ox3   Assessment and Plan: 1. Dizziness Drink 80-100 ounces water daily - Thyroid Panel With TSH; Future - CBC with Differential/Platelet; Future  2. Menstrual abnormality - CBC with Differential/Platelet; Future - FSH/LH; Future  3. Language barrier [employer] interpreters used and additional time performing visit was required.   4. Vitamin D deficiency - Vitamin D, 25-hydroxy; Future    Follow Up Instructions: Assign new PCP in 2-3 months    I discussed the assessment and treatment plan with the patient. The patient was provided an opportunity to ask questions and all were answered. The patient agreed with the plan and demonstrated an understanding of the instructions.   The patient was advised to call back or seek an in-person evaluation if the symptoms worsen or if the condition  fails to improve as anticipated.  I provided 15 minutes of non-face-to-face time during this encounter.   Georgian Co, PA-C

## 2021-01-06 ENCOUNTER — Other Ambulatory Visit: Payer: Self-pay | Admitting: Physician Assistant

## 2021-01-06 DIAGNOSIS — E559 Vitamin D deficiency, unspecified: Secondary | ICD-10-CM

## 2021-01-06 LAB — CBC WITH DIFFERENTIAL/PLATELET
Basophils Absolute: 0.1 10*3/uL (ref 0.0–0.2)
Basos: 1 %
EOS (ABSOLUTE): 0.2 10*3/uL (ref 0.0–0.4)
Eos: 2 %
Hematocrit: 41.2 % (ref 34.0–46.6)
Hemoglobin: 13.9 g/dL (ref 11.1–15.9)
Immature Grans (Abs): 0 10*3/uL (ref 0.0–0.1)
Immature Granulocytes: 0 %
Lymphocytes Absolute: 2.5 10*3/uL (ref 0.7–3.1)
Lymphs: 35 %
MCH: 32.6 pg (ref 26.6–33.0)
MCHC: 33.7 g/dL (ref 31.5–35.7)
MCV: 97 fL (ref 79–97)
Monocytes Absolute: 0.6 10*3/uL (ref 0.1–0.9)
Monocytes: 8 %
Neutrophils Absolute: 3.9 10*3/uL (ref 1.4–7.0)
Neutrophils: 54 %
Platelets: 279 10*3/uL (ref 150–450)
RBC: 4.27 x10E6/uL (ref 3.77–5.28)
RDW: 12.6 % (ref 11.7–15.4)
WBC: 7.2 10*3/uL (ref 3.4–10.8)

## 2021-01-06 LAB — THYROID PANEL WITH TSH
Free Thyroxine Index: 1.7 (ref 1.2–4.9)
T3 Uptake Ratio: 25 % (ref 24–39)
T4, Total: 6.7 ug/dL (ref 4.5–12.0)
TSH: 1.25 u[IU]/mL (ref 0.450–4.500)

## 2021-01-06 LAB — VITAMIN D 25 HYDROXY (VIT D DEFICIENCY, FRACTURES): Vit D, 25-Hydroxy: 15.9 ng/mL — ABNORMAL LOW (ref 30.0–100.0)

## 2021-01-06 LAB — FSH/LH
FSH: 2.3 m[IU]/mL
LH: 3.8 m[IU]/mL

## 2021-01-06 MED ORDER — VITAMIN D (ERGOCALCIFEROL) 1.25 MG (50000 UNIT) PO CAPS: 50000.0000 [IU] | ORAL_CAPSULE | ORAL | 0 refills | Status: DC

## 2021-02-10 IMAGING — CR DG CHEST 2V
2 series · 2 of 2 positions shown · non-contrast
Comparison: 3376

CLINICAL DATA: Left chest pain and left upper quadrant abdominal
pain

EXAM:
CHEST - 2 VIEW

[chest pa]
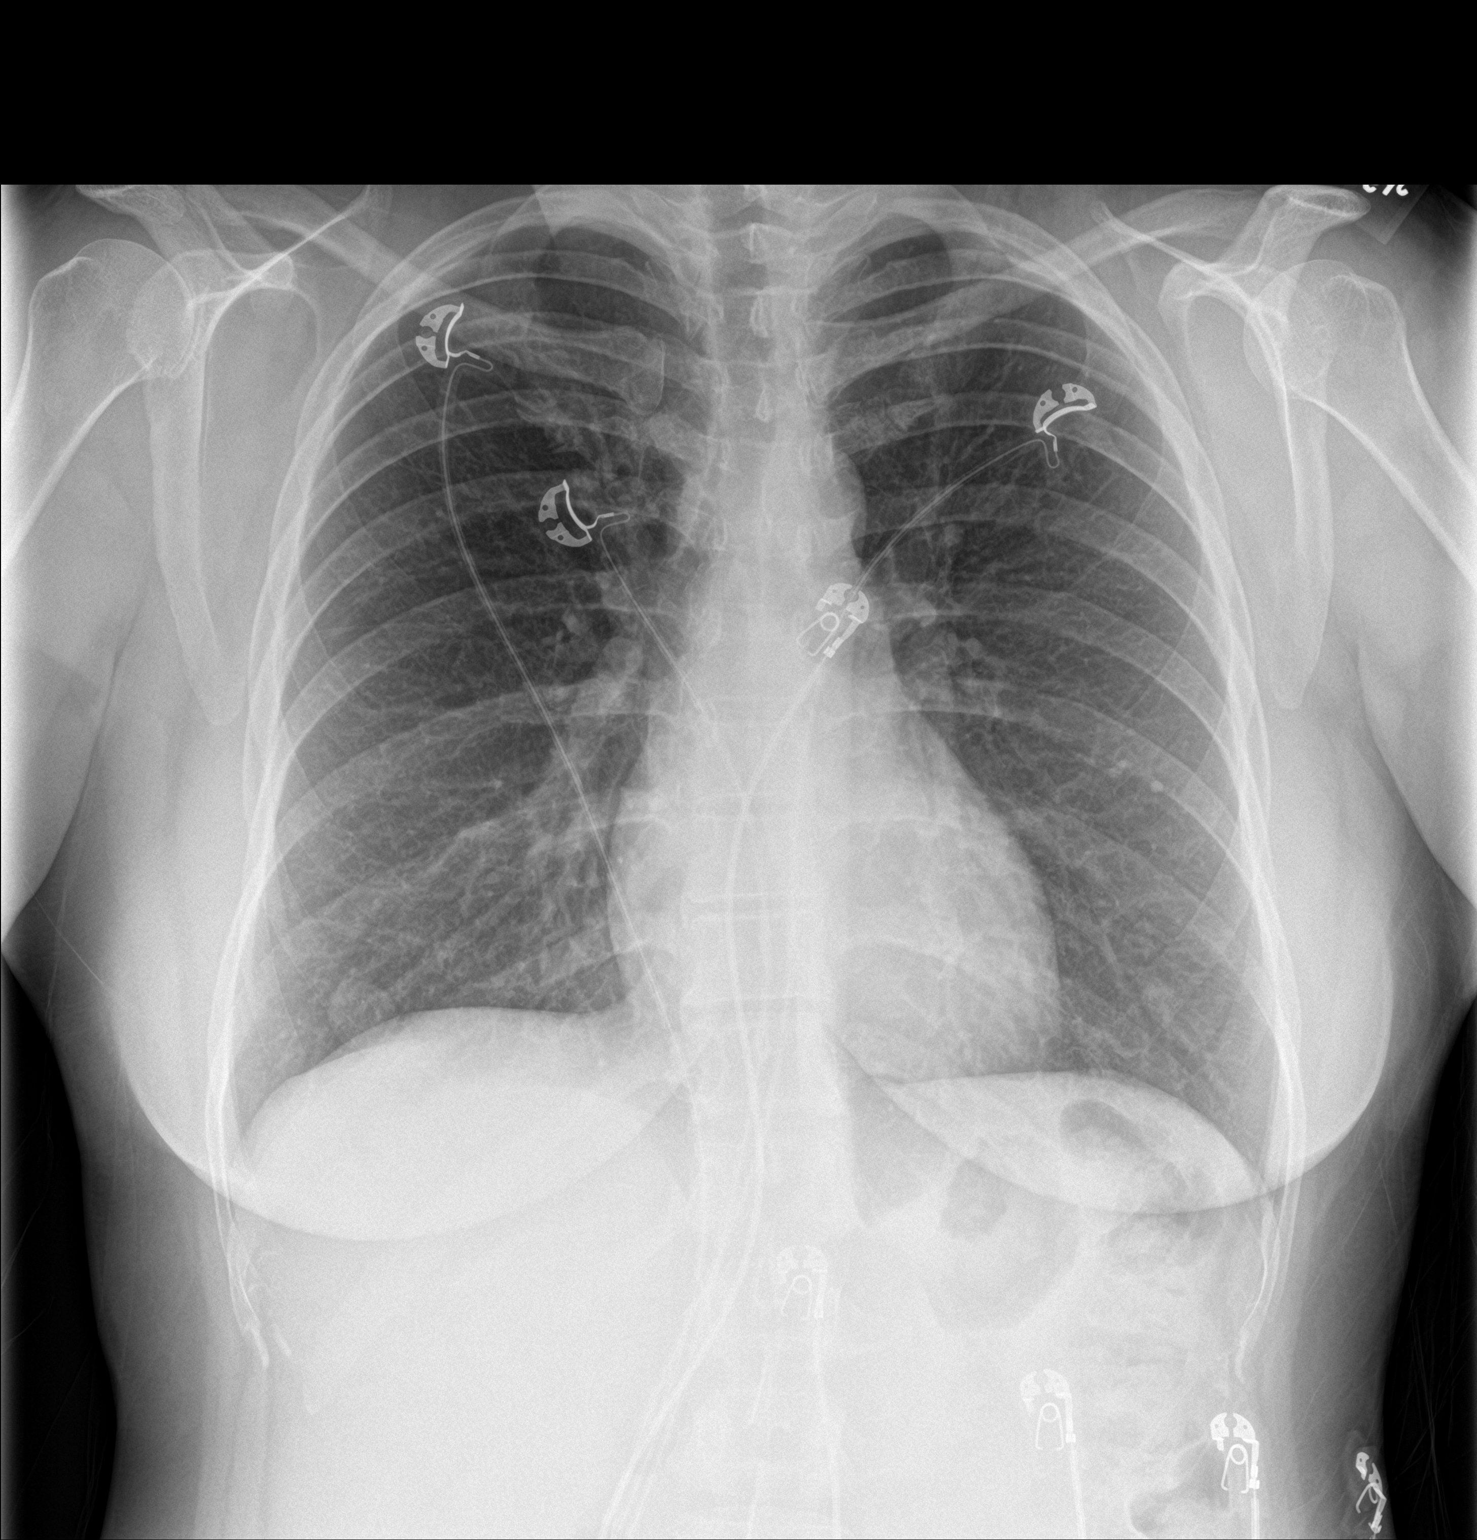

[chest lat]
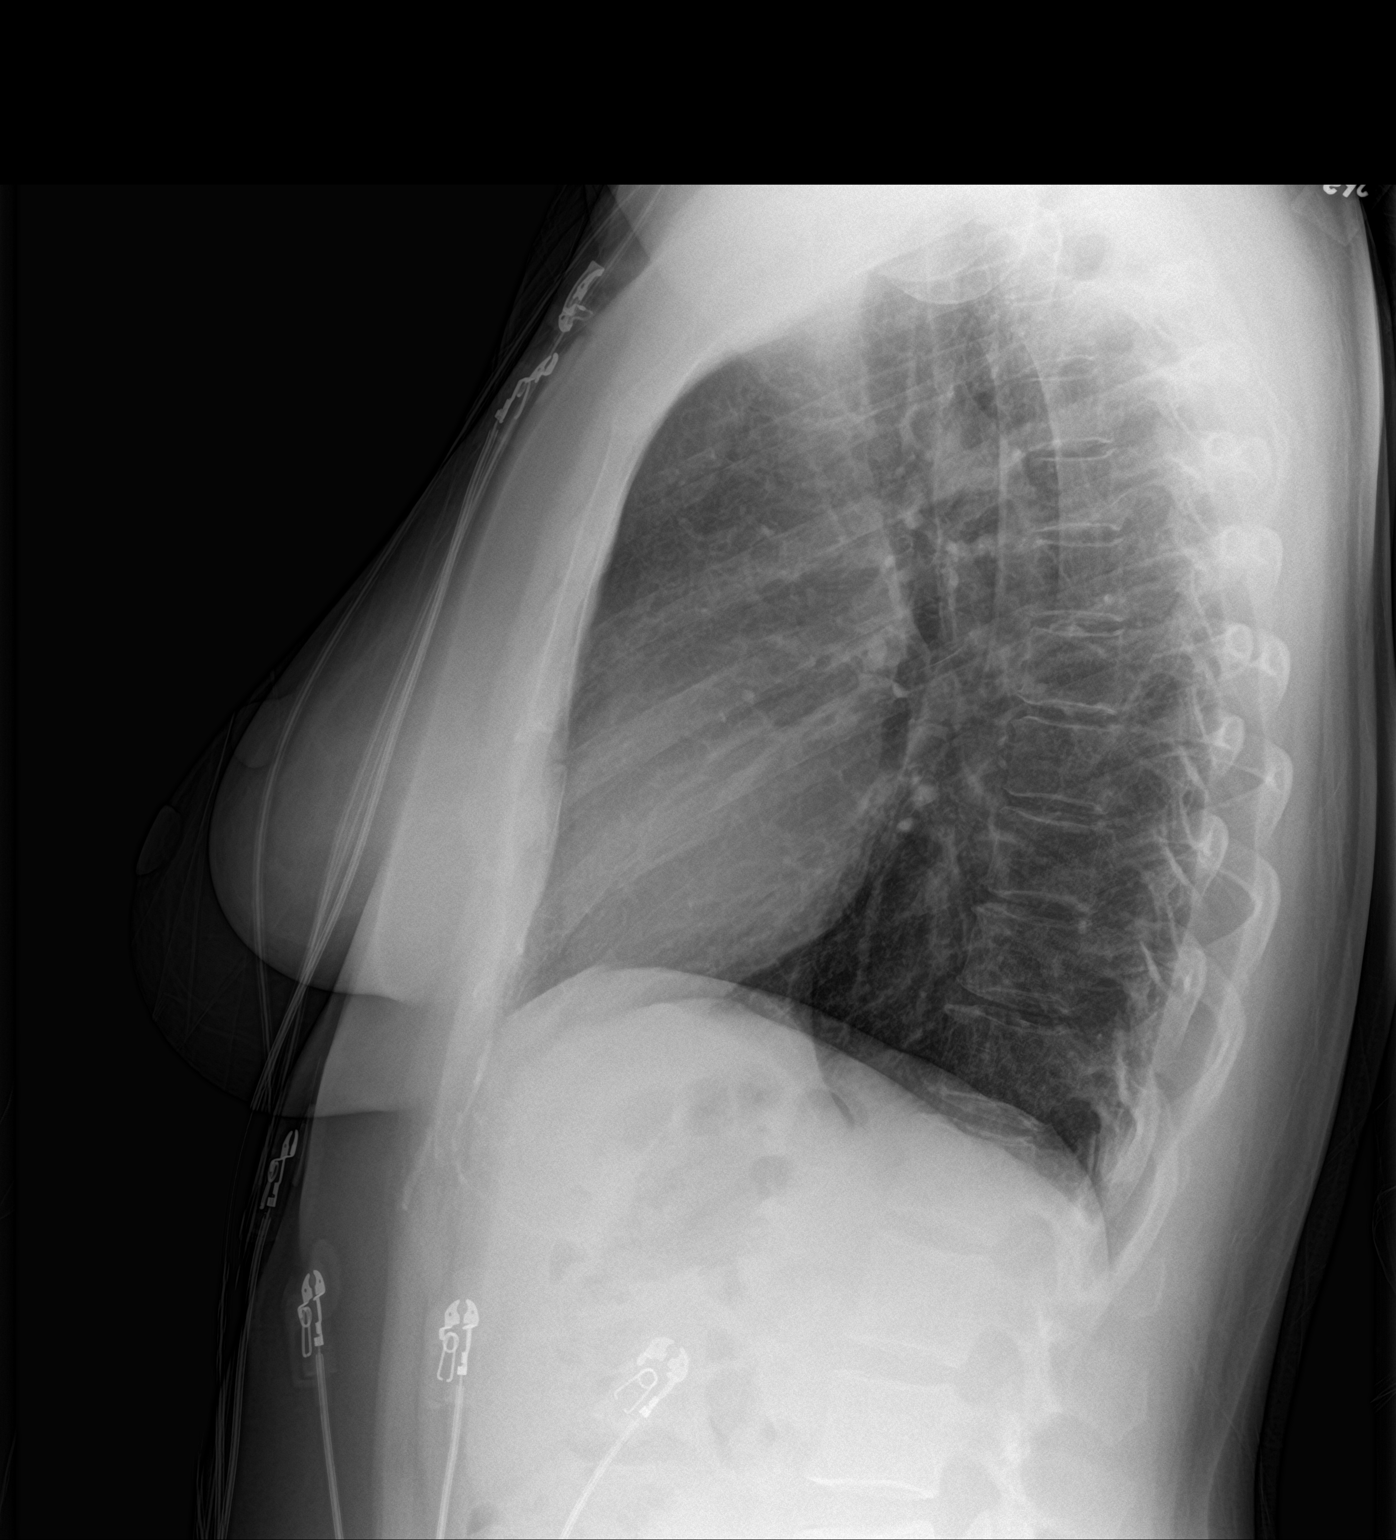

[2 of 2 positions shown; findings below may reference images not displayed]

FINDINGS: The heart size and mediastinal contours are within normal limits. No
new consolidation or edema. Left mid lung calcified granuloma. No
pleural effusion or pneumothorax. The visualized skeletal structures
are unremarkable. No free air under the diaphragms.
IMPRESSION: No acute process in the chest.

## 2021-03-18 ENCOUNTER — Ambulatory Visit: Payer: Self-pay | Admitting: Nurse Practitioner

## 2021-05-26 ENCOUNTER — Emergency Department (HOSPITAL_COMMUNITY)
Admission: EM | Admit: 2021-05-26 | Discharge: 2021-05-26 | Disposition: A | Discharge status: Home / Self Care | Payer: Self-pay | Attending: Emergency Medicine | Admitting: Emergency Medicine

## 2021-05-26 ENCOUNTER — Emergency Department (HOSPITAL_COMMUNITY): Payer: Self-pay

## 2021-05-26 ENCOUNTER — Encounter (HOSPITAL_COMMUNITY): Payer: Self-pay | Admitting: Emergency Medicine

## 2021-05-26 DIAGNOSIS — J45909 Unspecified asthma, uncomplicated: Secondary | ICD-10-CM | POA: Insufficient documentation

## 2021-05-26 DIAGNOSIS — Z7952 Long term (current) use of systemic steroids: Secondary | ICD-10-CM | POA: Insufficient documentation

## 2021-05-26 DIAGNOSIS — R002 Palpitations: Secondary | ICD-10-CM | POA: Insufficient documentation

## 2021-05-26 LAB — CBC
HCT: 40.3 % (ref 36.0–46.0)
Hemoglobin: 13.4 g/dL (ref 12.0–15.0)
MCH: 32.1 pg (ref 26.0–34.0)
MCHC: 33.3 g/dL (ref 30.0–36.0)
MCV: 96.6 fL (ref 80.0–100.0)
Platelets: 277 10*3/uL (ref 150–400)
RBC: 4.17 MIL/uL (ref 3.87–5.11)
RDW: 12.6 % (ref 11.5–15.5)
WBC: 7.7 10*3/uL (ref 4.0–10.5)
nRBC: 0 % (ref 0.0–0.2)

## 2021-05-26 LAB — BASIC METABOLIC PANEL
Anion gap: 7 (ref 5–15)
BUN: 14 mg/dL (ref 6–20)
CO2: 24 mmol/L (ref 22–32)
Calcium: 8.8 mg/dL — ABNORMAL LOW (ref 8.9–10.3)
Chloride: 107 mmol/L (ref 98–111)
Creatinine, Ser: 0.58 mg/dL (ref 0.44–1.00)
GFR, Estimated: >60 mL/min (ref >60)
Glucose, Bld: 81 mg/dL (ref 70–99)
Potassium: 4 mmol/L (ref 3.5–5.1)
Sodium: 138 mmol/L (ref 135–145)

## 2021-05-26 LAB — I-STAT BETA HCG BLOOD, ED (MC, WL, AP ONLY): I-stat hCG, quantitative: <5 m[IU]/mL (ref <5)

## 2021-05-26 LAB — TROPONIN I (HIGH SENSITIVITY): Troponin I (High Sensitivity): 3 ng/L (ref <18)

## 2021-05-26 NOTE — ED Triage Notes (Signed)
Patient complains of chest pain that started three days ago. States no activity seems to make pain better worse, also reports shortness of breath, dizziness, and intermittent nausea. Patient alert, oriented, speaking in complete sentences, and in no apparent distress at this time.

## 2021-05-26 NOTE — Discharge Instructions (Signed)
The test today in the emergency room were normal.  Follow-up with your doctor for further evaluation as we discussed.  Consider outpatient cardiac monitoring to check for any irregular heart rhythms.  Return to the ED as needed for recurrent or worsening symptoms

## 2021-05-26 NOTE — ED Provider Notes (Signed)
[facility] EMERGENCY DEPARTMENT Provider Note   CSN: [id number] Arrival date & time: 05/26/21  2458     History Chief Complaint  Patient presents with   Chest Pain    Jenaye Rickert Corey Harold is a 40 y.o. female.   Chest Pain  HPI: A 40 year old patient presents for evaluation of chest pain. Initial onset of pain was more than 6 hours ago. The patient's chest pain is not worse with exertion. The patient's chest pain is middle- or left-sided, is not well-localized, is not described as heaviness/pressure/tightness, is not sharp and does not radiate to the arms/jaw/neck. The patient does not complain of nausea and denies diaphoresis. The patient has no history of stroke, has no history of peripheral artery disease, has not smoked in the past 90 days, denies any history of treated diabetes, has no relevant family history of coronary artery disease (first degree relative at less than age 81), is not hypertensive, has no history of hypercholesterolemia and does not have an elevated BMI (>=30). Pt has noticed her heart beating irregularly.  It feels worse at night.  It is an uncomfortable sensation that feels like a needle.  It started two days ago.  The sx come and go.  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     Family History  Problem Relation Age of Onset   Colon cancer Father    Diabetes Maternal Grandmother    Diabetes Paternal Grandmother    Esophageal cancer Neg Hx    Rectal cancer Neg Hx    Stomach cancer Neg Hx     Social History   Tobacco Use   Smoking status: Never   Smokeless tobacco: Never  Vaping Use   Vaping Use: Never used  Substance Use Topics   Alcohol use: No   Drug use: No    Home Medications Prior to Admission medications   Medication Sig Start Date End Date Taking? Authorizing Provider  albuterol (VENTOLIN HFA) 108 (90 Base) MCG/ACT inhaler INHALE 2 PUFFS INTO THE LUNGS EVERY 6 (SIX) HOURS AS NEEDED. SHORTNESS OF BREATH 07/21/20 07/21/21  Hoy Register, MD  atorvastatin (LIPITOR) 10 MG tablet Take 1 tablet (10 mg total) by mouth daily. 01/22/20   Anders Simmonds, PA-C  cyclobenzaprine (FLEXERIL) 10 MG tablet Take 1 tablet (10 mg total) by mouth 2 (two) times daily as needed for muscle spasms. Patient not taking: Reported on 10/02/2019 09/26/15   Elpidio Anis, PA-C  gabapentin (NEURONTIN) 300 MG capsule Take 1 capsule (300 mg total) by mouth at bedtime. Prn numbness 01/21/20   Anders Simmonds, PA-C  ibuprofen (ADVIL,MOTRIN) 200 MG tablet Take 400 mg by mouth every 6 (six) hours as needed for moderate  pain.     [provider]  naproxen (NAPROSYN) 500 MG tablet Take 1 tablet (500 mg total) by mouth 2 (two) times daily with a meal. Prn pain 01/21/20   Anders Simmonds, PA-C  omeprazole (PRILOSEC) 40 MG capsule Take 1 capsule (40 mg total) by mouth 2 (two) times daily. 09/01/19   Tressia Danas, MD  predniSONE (DELTASONE) 10 MG tablet Take 2 tablets (20 mg total) by mouth daily. 07/20/20   Geoffery Lyons, MD  prochlorperazine (COMPAZINE) 10 MG tablet Take 1 tablet (10 mg total) by mouth every 8 (eight) hours as needed for nausea or vomiting (headache). Patient not taking: Reported on 10/02/2019 09/10/19   Cain Saupe, MD  traZODone (DESYREL) 50 MG tablet  Take 0.5-1 tablets (25-50 mg total) by mouth at bedtime as needed for sleep. Patient not taking: Reported on 10/02/2019 07/23/19   Fulp, Hewitt Shorts, MD  Vitamin D, Ergocalciferol, (DRISDOL) 1.25 MG (50000 UNIT) CAPS capsule Take 1 capsule (50,000 Units total) by mouth every 7 (seven) days. 01/06/21   Anders Simmonds, PA-C    Allergies    Patient has no known allergies.  Review of Systems   Review of Systems  Cardiovascular:  Positive for chest pain.  All other systems reviewed and are negative.  Physical Exam Updated Vital Signs BP 100/65 (BP Location: Right Arm)   Pulse 61   Temp 98.2 F (36.8 C) (Oral)   Resp (!) 21   SpO2 100%   Physical Exam Vitals and nursing note reviewed.  Constitutional:      General: She is not in acute distress.    Appearance: She is well-developed.  HENT:     Head: Normocephalic and atraumatic.     Right Ear: External ear normal.     Left Ear: External ear normal.  Eyes:     General: No scleral icterus.       Right eye: No discharge.        Left eye: No discharge.     Conjunctiva/sclera: Conjunctivae normal.  Neck:     Trachea: No tracheal deviation.  Cardiovascular:     Rate and Rhythm: Normal rate and regular rhythm.  Pulmonary:     Effort: Pulmonary effort is normal. No respiratory distress.     Breath sounds: Normal breath sounds. No stridor. No wheezing or rales.  Abdominal:     General: Bowel sounds are normal. There is no distension.     Palpations: Abdomen is soft.     Tenderness: There is no abdominal tenderness. There is no guarding or rebound.  Musculoskeletal:        General: No tenderness or deformity.     Cervical back: Neck supple.  Skin:    General: Skin is warm and dry.     Findings: No rash.  Neurological:     General: No focal deficit present.     Mental Status: She is alert.     Cranial Nerves: No cranial nerve deficit (no facial droop, extraocular movements intact, no slurred speech).     Sensory: No sensory deficit.      Motor: No abnormal muscle tone or seizure activity.     Coordination: Coordination normal.  Psychiatric:        Mood and Affect: Mood normal.    ED Results / Procedures / Treatments   Labs (all labs ordered are listed, but only abnormal results are displayed) Labs Reviewed  BASIC METABOLIC PANEL - Abnormal; Notable for the following components:  Result Value   Calcium 8.8 (*)    All other components within normal limits  CBC  I-STAT BETA HCG BLOOD, ED ([facility], [facility], [facility] ONLY)  TROPONIN I (HIGH SENSITIVITY)    EKG EKG Interpretation  Date/Time:  Thursday May 26 2021 16:53:34 EST Ventricular Rate:  61 PR Interval:  142 QRS Duration: 78 QT Interval:  429 QTC Calculation: 433 R Axis:   56 Text Interpretation: Sinus rhythm No significant change since last tracing Confirmed by Linwood Dibbles [phone]) on 05/26/2021 5:06:27 PM  Radiology DG Chest 2 View  Result Date: 05/26/2021 CLINICAL DATA:  40 year old female with left side chest pain, shortness of breath and dizziness for 2 days. EXAM: CHEST - 2 VIEW COMPARISON:  Chest radiographs 07/20/2020 and earlier. FINDINGS: Lung volumes and mediastinal contours remain normal. Small calcified granuloma in the left mid lung is stable and benign. Elsewhere both lungs appear clear. No pneumothorax or pleural effusion. No acute osseous abnormality identified. Negative visible bowel gas pattern. IMPRESSION: Negative. No acute cardiopulmonary abnormality. Electronically Signed   By: Odessa Fleming M.D.   On: 05/26/2021 11:23    Procedures Procedures   Medications Ordered in ED Medications - No data to display  ED Course  I have reviewed the triage vital signs and the nursing notes.  Pertinent labs & imaging results that were available during my care of the patient were reviewed by me and considered in my medical decision making (see chart for details).  Clinical Course as of 05/26/21 1708  Thu May 26, 2021  1707 CBC metabolic panel troponin are  normal. [JK]  1708 Chest x-ray without acute findings [JK]    Clinical Course User Index [JK] Linwood Dibbles, MD   MDM Rules/Calculators/A&P HEAR Score: 0                         Patient presented to the ED for evaluation of palpitations.  Patient's symptoms are not suggestive of ACS.  She is not having shortness of breath suggest pulmonary embolism.  Patient's vital signs here are normal.  She is not tachycardic or hypertensive.  Her serial EKGs are normal without signs of any cardiac dysrhythmia.  No signs of anemia no electrolyte abnormalities.  Patient's ED work-up is overall reassuring.  Recommend outpatient follow-up with primary care doctor.  Consider outpatient cardiac monitoring Final Clinical Impression(s) / ED Diagnoses Final diagnoses:  Palpitations    Rx / DC Orders ED Discharge Orders     None        Linwood Dibbles, MD 05/26/21 1708

## 2021-06-24 ENCOUNTER — Ambulatory Visit: Payer: Self-pay

## 2021-06-24 NOTE — Telephone Encounter (Signed)
3rd attempt to reach patient regarding her symptoms. Left message to call office

## 2021-06-24 NOTE — Telephone Encounter (Signed)
Interpreter: Wille Glaser [phone] 2nd attempt to reach patient- left message to call office

## 2021-06-24 NOTE — Telephone Encounter (Signed)
Patient called, left VM to return the call to the office to discuss symptoms with a nurse.  Summary: menstrual bleeding   Pt experiencing a lot of bleeding during period, been going on for about 4 months every time she gets her periods. Feels weak. Seeking clinical advise [phone]

## 2021-08-09 ENCOUNTER — Encounter: Payer: Self-pay | Admitting: Family Medicine

## 2021-08-09 ENCOUNTER — Other Ambulatory Visit: Payer: Self-pay

## 2021-08-09 ENCOUNTER — Ambulatory Visit: Payer: Self-pay | Attending: Family Medicine | Admitting: Family Medicine

## 2021-08-09 VITALS — BP 103/70 | HR 76 | Ht 66.0 in | Wt 166.2 lb

## 2021-08-09 DIAGNOSIS — N939 Abnormal uterine and vaginal bleeding, unspecified: Secondary | ICD-10-CM

## 2021-08-09 DIAGNOSIS — M2550 Pain in unspecified joint: Secondary | ICD-10-CM

## 2021-08-09 DIAGNOSIS — Z1159 Encounter for screening for other viral diseases: Secondary | ICD-10-CM

## 2021-08-09 MED ORDER — MELOXICAM 7.5 MG PO TABS: 7.5000 mg | ORAL_TABLET | Freq: Every day | ORAL | 1 refills | Status: DC

## 2021-08-09 NOTE — Patient Instructions (Signed)
Sangrado uterino anormal Abnormal Uterine Bleeding El sangrado uterino anormal es el sangrado del tero ms duradero o ms abundante de lo normal. Esto puede incluir lo siguiente: Sangrado luego de mantener relaciones sexuales. Sangrado entre los perodos mensuales (menstruales). Sangrado ms abundante de lo normal. Perodos mensuales que duran ms de lo normal. Sangrado despus de haber dejado de tener el perodo mensual (menopausia). Si tiene un sangrado que no considera normal, debe consultar al mdico. El tratamiento depende de la causa del sangrado y de cunto sangre. Siga estas indicaciones en su casa: Medicamentos Use los medicamentos de venta libre y los recetados solamente como se lo haya indicado el mdico. Consulte al mdico si debe hacer o no lo siguiente: Tomar medicamentos como aspirina e ibuprofeno. No tome estos medicamentos a menos que el mdico se lo indique. Usar medicamentos de venta libre, vitaminas, hierbas y suplementos. Es posible que le den comprimidos de hierro. Tmelos como se lo haya indicado el mdico. Control del estreimiento Si toma comprimidos de hierro, es posible que deba tomar estas medidas para prevenir o tratar los problemas para defecar (estreimiento): Beber suficiente lquido para mantener el pis (la orina) de color amarillo plido. Usar medicamentos recetados o de venta libre. Comer alimentos ricos en fibra. Entre ellos, frijoles, cereales integrales y frutas y verduras frescas. Limitar los alimentos con alto contenido de grasa y azcar. Estos incluyen alimentos fritos o dulces. Actividad Haga cambios en su actividad para reducir el sangrado si necesita cambiar la toalla higinica ms de una vez cada 2 horas: Acustese en la cama con los pies levantados (elevados). Coloque una compresa fra en la zona inferior del vientre. Descanse todo lo que pueda hasta que el sangrado se detenga o no sea tan intenso. Indicaciones generales No use tampones, no  se haga duchas vaginales ni tenga relaciones sexuales hasta que el mdico le diga que puede hacerlo. Cmbiese las toallas con frecuencia. Hgase exmenes regulares. Estos incluyen: Exmenes plvicos. Exmenes para detectar el cncer de cuello uterino. Es su responsabilidad obtener los resultados de cualquier examen que se realice. Pregunte cmo obtener sus resultados cuando estn listos. Fjese si hay algn cambio en el sangrado. Durante 2 meses, anote: Cundo comienza su perodo mensual. Cundo finaliza su perodo mensual. Cundo tiene alguna hemorragia anormal por la vagina. Qu problemas observa. Concurra a todas las visitas de seguimiento. Comunquese con un mdico si: El sangrado dura ms de una semana. Se siente mareada por momentos. Tiene ganas de vomitar (nuseas). Vomita. Se siente dbil o siente que va a desvanecerse. Sus sntomas empeoran. Solicite ayuda de inmediato si: Se desmaya. Debe cambiarse los apsitos cada hora. Tiene dolor de vientre. Tiene fiebre o escalofros. Se siente sudorosa o dbil. Elimina cogulos de sangre grandes por la vagina. Estos sntomas pueden indicar una emergencia. Solicite ayuda de inmediato. Comunquese con el servicio de emergencias de su localidad (911 en los [place]). No espere a ver si los sntomas desaparecen. No conduzca por sus propios medios hasta el hospital. Resumen El sangrado uterino anormal es el sangrado del tero ms duradero o ms abundante de lo normal. Cualquier tipo de sangrado que no sea normal debe consultarse con el mdico. El tratamiento depende de la causa del sangrado y de cunto sangre. Obtenga ayuda de inmediato si se desmaya, tiene que cambiarse la toalla cada hora o elimina cogulos de sangre grandes por la vagina. Esta informacin no tiene como fin reemplazar el consejo del mdico. Asegrese de hacerle al mdico cualquier pregunta que tenga. Document   Revised: 10/27/2020 Document Reviewed:  10/27/2020 Elsevier Patient Education  2022 [employer]vier Inc.  

## 2021-08-09 NOTE — Progress Notes (Signed)
Having pain in feet and hands Dizziness.

## 2021-08-09 NOTE — Progress Notes (Signed)
Subjective:  Patient ID: Peggy Monroe, female    DOB: 28-Oct-1980  Age: 41 y.o. MRN: [id number]  CC: MENSTRUAL BLEEDING   HPI Peggy Monroe is a 41 y.o. year old female who presents today to establish care.  Interval History: She complains of pain in hands and feet for the last 1 week and waxes and wanes especially in the joints. She did not have any recent febrile illness recently. Her wrists, MCP joints, ankles, soles of her feet hurt.  Also complains of abnormal uterine bleed. Periods last 5 days and then she spots for 3 weeks. She endorses passage of blood clots and thi shas been present for the last 4 months and prior to that cycles were normal. She has pelvic cramping sometimes with her periods. She is not sexually active,; has a 43 year old child. She attained menarche at 8.5 years. She is not on any contraception. Last PAP smear was last year and was normal (performed in the practice in [place]) Past Medical History:  Diagnosis Date   Asthma    HLD (hyperlipidemia)    Recurrent upper respiratory infection (URI)    Shortness of breath    " with my asthma"    Past Surgical History:  Procedure Laterality Date   CESAREAN SECTION     ELBOW SURGERY Left At age 41   TONSILLECTOMY      Family History  Problem Relation Age of Onset   Colon cancer Father    Diabetes Maternal Grandmother    Diabetes Paternal Grandmother    Esophageal cancer Neg Hx    Rectal cancer Neg Hx    Stomach cancer Neg Hx     No Known Allergies  Outpatient Medications Prior to Visit  Medication Sig Dispense Refill   albuterol (VENTOLIN HFA) 108 (90 Base) MCG/ACT inhaler INHALE 2 PUFFS INTO THE LUNGS EVERY 6 (SIX) HOURS AS NEEDED. SHORTNESS OF BREATH 18 g 0   atorvastatin (LIPITOR) 10 MG tablet Take 1 tablet (10 mg total) by mouth daily. (Patient not taking: Reported on 08/09/2021) 90 tablet 3   cyclobenzaprine (FLEXERIL) 10 MG tablet Take 1 tablet (10 mg total) by mouth 2  (two) times daily as needed for muscle spasms. (Patient not taking: Reported on 10/02/2019) 20 tablet 0   gabapentin (NEURONTIN) 300 MG capsule Take 1 capsule (300 mg total) by mouth at bedtime. Prn numbness (Patient not taking: Reported on 08/09/2021) 30 capsule 5   ibuprofen (ADVIL,MOTRIN) 200 MG tablet Take 400 mg by mouth every 6 (six) hours as needed for moderate pain.  (Patient not taking: Reported on 08/09/2021)     omeprazole (PRILOSEC) 40 MG capsule Take 1 capsule (40 mg total) by mouth 2 (two) times daily. (Patient not taking: Reported on 08/09/2021) 60 capsule 3   predniSONE (DELTASONE) 10 MG tablet Take 2 tablets (20 mg total) by mouth daily. (Patient not taking: Reported on 08/09/2021) 20 tablet 0   prochlorperazine (COMPAZINE) 10 MG tablet Take 1 tablet (10 mg total) by mouth every 8 (eight) hours as needed for nausea or vomiting (headache). (Patient not taking: Reported on 10/02/2019) 10 tablet 3   traZODone (DESYREL) 50 MG tablet Take 0.5-1 tablets (25-50 mg total) by mouth at bedtime as needed for sleep. (Patient not taking: Reported on 10/02/2019) 30 tablet 3   Vitamin D, Ergocalciferol, (DRISDOL) 1.25 MG (50000 UNIT) CAPS capsule Take 1 capsule (50,000 Units total) by mouth every 7 (seven) days. (Patient not taking: Reported on 08/09/2021) 16  capsule 0   naproxen (NAPROSYN) 500 MG tablet Take 1 tablet (500 mg total) by mouth 2 (two) times daily with a meal. Prn pain (Patient not taking: Reported on 08/09/2021) 60 tablet 1   No facility-administered medications prior to visit.     ROS Review of Systems  Constitutional:  Negative for activity change, appetite change and fatigue.  HENT:  Negative for congestion, sinus pressure and sore throat.   Eyes:  Negative for visual disturbance.  Respiratory:  Negative for cough, chest tightness, shortness of breath and wheezing.   Cardiovascular:  Negative for chest pain and palpitations.  Gastrointestinal:  Negative for abdominal distention,  abdominal pain and constipation.  Endocrine: Negative for polydipsia.  Genitourinary:  Positive for menstrual problem. Negative for dysuria and frequency.  Musculoskeletal:        See HPI  Skin:  Negative for rash.  Neurological:  Negative for tremors, light-headedness and numbness.  Hematological:  Does not bruise/bleed easily.  Psychiatric/Behavioral:  Negative for agitation and behavioral problems.    Objective:  BP 103/70    Pulse 76    Ht 5\' 6"  (1.676 m)    Wt 166 lb 3.2 oz (75.4 kg)    SpO2 99%    BMI 26.83 kg/m   BP/Weight 08/09/2021 [id number]  Systolic BP [id number]  Diastolic BP 70 65 60  Wt. (Lbs) 166.2 - -  BMI 26.83 - -      Physical Exam Constitutional:      Appearance: She is well-developed.  Cardiovascular:     Rate and Rhythm: Normal rate.     Heart sounds: Normal heart sounds. No murmur heard. Pulmonary:     Effort: Pulmonary effort is normal.     Breath sounds: Normal breath sounds. No wheezing or rales.  Chest:     Chest wall: No tenderness.  Abdominal:     General: Bowel sounds are normal. There is no distension.     Palpations: Abdomen is soft. There is no mass.     Tenderness: There is no abdominal tenderness.  Musculoskeletal:        General: Normal range of motion.     Right lower leg: No edema.     Left lower leg: No edema.  Neurological:     Mental Status: She is alert and oriented to person, place, and time.  Psychiatric:        Mood and Affect: Mood normal.    CMP Latest Ref Rng & Units 05/26/2021 07/20/2020 07/14/2019  Glucose 70 - 99 mg/dL 81 87 92  BUN 6 - 20 mg/dL 14 14 11   Creatinine 0.44 - 1.00 mg/dL 0.58 0.62 0.65  Sodium 135 - 145 mmol/L 138 138 139  Potassium 3.5 - 5.1 mmol/L 4.0 3.9 4.2  Chloride 98 - 111 mmol/L 107 106 108  CO2 22 - 32 mmol/L 24 23 22   Calcium 8.9 - 10.3 mg/dL 8.8(L) 9.1 8.7(L)  Total Protein 6.5 - 8.1 g/dL - - 6.7  Total Bilirubin 0.3 - 1.2 mg/dL - - 0.9  Alkaline Phos 38 - 126 U/L - - 45  AST 15  - 41 U/L - - 17  ALT 0 - 44 U/L - - 21    Lipid Panel     Component Value Date/Time   CHOL 211 (H) 01/21/2020 0945   TRIG 156 (H) 01/21/2020 0945   HDL 45 01/21/2020 0945   CHOLHDL 4.7 (H) 01/21/2020 0945   CHOLHDL 4.0 10/24/2011 0540   VLDL  23 10/24/2011 0540   LDLCALC 138 (H) 01/21/2020 0945    CBC    Component Value Date/Time   WBC 7.7 05/26/2021 1008   RBC 4.17 05/26/2021 1008   HGB 13.4 05/26/2021 1008   HGB 13.9 01/05/2021 0944   HCT 40.3 05/26/2021 1008   HCT 41.2 01/05/2021 0944   PLT 277 05/26/2021 1008   PLT 279 01/05/2021 0944   MCV 96.6 05/26/2021 1008   MCV 97 01/05/2021 0944   MCH 32.1 05/26/2021 1008   MCHC 33.3 05/26/2021 1008   RDW 12.6 05/26/2021 1008   RDW 12.6 01/05/2021 0944   LYMPHSABS 2.5 01/05/2021 0944   MONOABS 0.6 04/12/2015 1221   EOSABS 0.2 01/05/2021 0944   BASOSABS 0.1 01/05/2021 0944    Lab Results  Component Value Date   HGBA1C 5.4 09/10/2019    Assessment & Plan:  1. Abnormal uterine bleeding We will evaluate for underlying thyroid disorder She will need a Pap smear and has been scheduled for next visit If unrevealing consider placement on oral contraceptive pills - T4, free - TSH - US Pelvic Complete With Transvaginal; Future  2. Arthralgia, unspecified joint Unknown etiology Autoimmune disease work-up; consider osteoarthritis as a possible cause. Symptoms have been on for short duration which raises concern for possible acute illness of viral illness. Placed on Mobic - Sedimentation rate - C-reactive protein - Anti-Smith antibody - Anti-DNA antibody, double-stranded - ANA,IFA RA Diag Pnl w/rflx Tit/Patn - CBC with Differential/Platelet  3. Screening for viral disease - HCV Ab w Reflex to Quant PCR - HIV Antibody (routine testing w rflx)   Meds ordered this encounter  Medications   meloxicam (MOBIC) 7.5 MG tablet    Sig: Take 1 tablet (7.5 mg total) by mouth daily.    Dispense:  30 tablet    Refill:  1     Follow-up: Return in about 6 weeks (around 09/20/2021) for Pap smear.       Charlott Rakes, MD, FAAFP. [facility] and [place] [facility], [place]   08/09/2021, 2:44 PM

## 2021-08-12 ENCOUNTER — Other Ambulatory Visit: Payer: Self-pay

## 2021-08-12 ENCOUNTER — Telehealth: Payer: Self-pay

## 2021-08-12 ENCOUNTER — Ambulatory Visit (HOSPITAL_COMMUNITY)
Admission: RE | Admit: 2021-08-12 | Discharge: 2021-08-12 | Disposition: A | Discharge status: Home / Self Care | Payer: Self-pay | Source: Ambulatory Visit | Attending: Family Medicine | Admitting: Family Medicine

## 2021-08-12 DIAGNOSIS — N939 Abnormal uterine and vaginal bleeding, unspecified: Secondary | ICD-10-CM | POA: Insufficient documentation

## 2021-08-12 NOTE — Telephone Encounter (Signed)
Contacted pt to go over lab results pt is aware and doesn't have any questions or concerns 

## 2021-08-14 LAB — CBC WITH DIFFERENTIAL/PLATELET
Basophils Absolute: 0.1 10*3/uL (ref 0.0–0.2)
Basos: 1 %
EOS (ABSOLUTE): 0.1 10*3/uL (ref 0.0–0.4)
Eos: 1 %
Hematocrit: 37.9 % (ref 34.0–46.6)
Hemoglobin: 12.7 g/dL (ref 11.1–15.9)
Immature Grans (Abs): 0 10*3/uL (ref 0.0–0.1)
Immature Granulocytes: 0 %
Lymphocytes Absolute: 2.4 10*3/uL (ref 0.7–3.1)
Lymphs: 23 %
MCH: 31.8 pg (ref 26.6–33.0)
MCHC: 33.5 g/dL (ref 31.5–35.7)
MCV: 95 fL (ref 79–97)
Monocytes Absolute: 0.7 10*3/uL (ref 0.1–0.9)
Monocytes: 7 %
Neutrophils Absolute: 7.1 10*3/uL — ABNORMAL HIGH (ref 1.4–7.0)
Neutrophils: 68 %
Platelets: 314 10*3/uL (ref 150–450)
RBC: 4 x10E6/uL (ref 3.77–5.28)
RDW: 12.1 % (ref 11.7–15.4)
WBC: 10.3 10*3/uL (ref 3.4–10.8)

## 2021-08-14 LAB — HCV INTERPRETATION

## 2021-08-14 LAB — SEDIMENTATION RATE: Sed Rate: 4 mm/hr (ref 0–32)

## 2021-08-14 LAB — ANTI-SMITH ANTIBODY: ENA SM Ab Ser-aCnc: <0.2 AI (ref 0.0–0.9)

## 2021-08-14 LAB — ANA,IFA RA DIAG PNL W/RFLX TIT/PATN
ANA Titer 1: NEGATIVE
Cyclic Citrullin Peptide Ab: <1 units (ref 0–19)
Rheumatoid fact SerPl-aCnc: <10 IU/mL (ref <14.0)

## 2021-08-14 LAB — TSH: TSH: 1.13 u[IU]/mL (ref 0.450–4.500)

## 2021-08-14 LAB — T4, FREE: Free T4: 1.17 ng/dL (ref 0.82–1.77)

## 2021-08-14 LAB — C-REACTIVE PROTEIN: CRP: 1 mg/L (ref 0–10)

## 2021-08-14 LAB — HIV ANTIBODY (ROUTINE TESTING W REFLEX): HIV Screen 4th Generation wRfx: NONREACTIVE

## 2021-08-14 LAB — ANTI-DNA ANTIBODY, DOUBLE-STRANDED: dsDNA Ab: 2 IU/mL (ref 0–9)

## 2021-08-14 LAB — HCV AB W REFLEX TO QUANT PCR: HCV Ab: NONREACTIVE

## 2021-08-15 ENCOUNTER — Other Ambulatory Visit: Payer: Self-pay | Admitting: Family Medicine

## 2021-08-15 DIAGNOSIS — N83201 Unspecified ovarian cyst, right side: Secondary | ICD-10-CM

## 2021-08-22 ENCOUNTER — Telehealth: Payer: Self-pay

## 2021-08-22 NOTE — Telephone Encounter (Signed)
Using Spanish interpreter # [phone], Pt. Given ultrasound results and instructions. Please inform pt. Of MRI appointment. ?

## 2021-08-24 NOTE — Progress Notes (Signed)
Pt has been scheduled for MRI, patient has been informed.

## 2021-08-24 NOTE — Telephone Encounter (Signed)
MRI has been scheduled and patient has been informed. ?

## 2021-08-28 ENCOUNTER — Ambulatory Visit (HOSPITAL_COMMUNITY): Admission: RE | Admit: 2021-08-28 | Payer: Self-pay | Source: Ambulatory Visit

## 2022-03-29 ENCOUNTER — Ambulatory Visit: Payer: Self-pay | Admitting: *Deleted

## 2022-03-29 NOTE — Telephone Encounter (Signed)
Interpreter: [id number]  Chief Complaint: fatigue, hot/cold flushes, upper abdominal pain Symptoms: multiple complaints- fatigue, abdominal pain  Frequency: pain- 2 days, fatigue-6 days Pertinent Negatives: Patient denies fever Disposition: [] ED /[x] Urgent Care (no appt availability in office) / [] Appointment(In office/virtual)/ []  [facility]/ [] Home Care/ [] Refused Recommended Disposition /[x] [facility]/ []  Follow-up with PCP Additional Notes: Patient advised of options= mobile unit, UC - information given for mobile unit.  Reason for Disposition  [1] MILD pain (e.g., does not interfere with normal activities) AND [2] pain comes and goes (cramps) AND [3] present > 48 hours  (Exception: This same abdominal pain is a chronic symptom recurrent or ongoing AND present > 4 weeks.)  [1] MODERATE weakness (i.e., interferes with work, school, normal activities) AND [2] persists > 3 days  Answer Assessment - Initial Assessment Questions 1. DESCRIPTION: "Describe how you are feeling."     Weakness, sleepy 2. SEVERITY: "How bad is it?"  "Can you stand and walk?"   - MILD (0-3): Feels weak or tired, but does not interfere with work, school or normal activities.   - MODERATE (4-7): Able to stand and walk; weakness interferes with work, school, or normal activities.   - SEVERE (8-10): Unable to stand or walk; unable to do usual activities.     mild 3. ONSET: "When did these symptoms begin?" (e.g., hours, days, weeks, months)     6 days 4. CAUSE: "What do you think is causing the weakness or fatigue?" (e.g., not drinking enough fluids, medical problem, trouble sleeping)     Unsure- trouble sleeping 5. NEW MEDICINES:  "Have you started on any new medicines recently?" (e.g., opioid pain medicines, benzodiazepines, muscle relaxants, antidepressants, antihistamines, neuroleptics, beta blockers)     Theraflu and tylenol- started with symptoms 6. OTHER SYMPTOMS: "Do you have any  other symptoms?" (e.g., chest pain, fever, cough, SOB, vomiting, diarrhea, bleeding, other areas of pain)     Pain- stomach pain 7. PREGNANCY: "Is there any chance you are pregnant?" "When was your last menstrual period?"  Answer Assessment - Initial Assessment Questions 1. LOCATION: "Where does it hurt?"      Left upper abdomen 2. RADIATION: "Does the pain shoot anywhere else?" (e.g., chest, back)     no 3. ONSET: "When did the pain begin?" (e.g., minutes, hours or days ago)      2 days 4. SUDDEN: "Gradual or sudden onset?"     *No Answer* 5. PATTERN "Does the pain come and go, or is it constant?"    - If it comes and goes: "How long does it last?" "Do you have pain now?"     (Note: Comes and goes means the pain is intermittent. It goes away completely between bouts.)    - If constant: "Is it getting better, staying the same, or getting worse?"      (Note: Constant means the pain never goes away completely; most serious pain is constant and gets worse.)      No pain now-comes and goes- lasting 20 minutes, 2 times/day 6. SEVERITY: "How bad is the pain?"  (e.g., Scale 1-10; mild, moderate, or severe)    - MILD (1-3): Doesn't interfere with normal activities, abdomen soft and not tender to touch.     - MODERATE (4-7): Interferes with normal activities or awakens from sleep, abdomen tender to touch.     - SEVERE (8-10): Excruciating pain, doubled over, unable to do any normal activities.       None now-  moderate 7. RECURRENT SYMPTOM: "Have you ever had this type of stomach pain before?" If Yes, ask: "When was the last time?" and "What happened that time?"      no 8. CAUSE: "What do you think is causing the stomach pain?"     unsure 9. RELIEVING/AGGRAVATING FACTORS: "What makes it better or worse?" (e.g., antacids, bending or twisting motion, bowel movement)     tylenol 10. OTHER SYMPTOMS: "Do you have any other symptoms?" (e.g., back pain, diarrhea, fever, urination pain, vomiting)        no 11. PREGNANCY: "Is there any chance you are pregnant?" "When was your last menstrual period?"       No- LMP-9/24 , hx fibroids- first 4 days- very heavy- 10 day- continues  Protocols used: Weakness (Generalized) and Fatigue-A-AH, Abdominal Pain - [facility]

## 2022-03-30 ENCOUNTER — Encounter: Payer: Self-pay | Admitting: Physician Assistant

## 2022-03-30 ENCOUNTER — Ambulatory Visit (INDEPENDENT_AMBULATORY_CARE_PROVIDER_SITE_OTHER): Payer: Self-pay | Admitting: Physician Assistant

## 2022-03-30 VITALS — BP 100/56 | Ht 61.02 in | Wt 168.0 lb

## 2022-03-30 DIAGNOSIS — R52 Pain, unspecified: Secondary | ICD-10-CM

## 2022-03-30 DIAGNOSIS — L290 Pruritus ani: Secondary | ICD-10-CM

## 2022-03-30 DIAGNOSIS — R6883 Chills (without fever): Secondary | ICD-10-CM

## 2022-03-30 DIAGNOSIS — L299 Pruritus, unspecified: Secondary | ICD-10-CM

## 2022-03-30 MED ORDER — CETIRIZINE HCL 10 MG PO TABS: 10.0000 mg | ORAL_TABLET | Freq: Every day | ORAL | 11 refills | Status: DC

## 2022-03-30 MED ORDER — PYRANTEL PAMOATE 144 (50 BASE) MG/ML PO SUSP: 11.0000 mg/kg | Freq: Once | ORAL | 0 refills | Status: AC

## 2022-03-30 MED ORDER — HYDROCORTISONE ACETATE 25 MG RE SUPP: 25.0000 mg | Freq: Two times a day (BID) | RECTAL | 0 refills | Status: DC

## 2022-03-30 NOTE — Patient Instructions (Addendum)
Pinworms Pinworms are a type of parasite that causes a common infection of Peggy intestines. They are small, white worms that spread very easily from person to person (are contagious). What are Peggy causes? This condition is caused by swallowing Peggy eggs of a pinworm. Peggy eggs can be found in infected (contaminated) food or beverages or on hands, toys, or clothing. After Peggy eggs have been swallowed, they hatch in Peggy intestines. When they grow and mature, Peggy female worms travel out of Peggy opening between Peggy buttocks (anus) and lay eggs in Peggy anal area at night. These eggs then contaminate everything they come into contact with, including skin, clothes, towels, and bedding. This continues Peggy cycle of infection. What increases Peggy risk? This condition is likely to develop in children who come in contact with many other people, such as at a daycare or school, and in people who live in long-term care facilities. It can then be passed to family members or other caregivers. What are Peggy signs or symptoms? Symptoms of this condition include: Itching at Peggy anus, or Peggy area around Peggy anus, especially at night. Trouble sleeping and restlessness. Nausea or pain in Peggy abdomen. Bedwetting. Trouble urinating. Vaginal discharge or itching. In some cases, there are no symptoms. In rare cases, allergic reactions or worms traveling to other parts of Peggy body may cause problems, including pain, additional infection, or inflammation. How is this diagnosed? This condition is diagnosed based on your medical history and a physical exam. Your health care provider may ask you to apply a piece of adhesive tape to your anal area in Peggy morning before using Peggy bathroom. Peggy eggs will stick to Peggy tape. Your health care provider will then look at Peggy tape under a microscope to confirm Peggy diagnosis. How is this treated? This condition may be treated with: Anti-parasitic oral medicine to get rid of Peggy  pinworms. Topical medicines to help with itching, such as petroleum gel. Your health care provider may recommend that everyone in your household and any caregivers also be treated for pinworms. Follow these instructions at home: Medicines Take or apply over-Peggy-counter and prescription medicines only as told by your health care provider. If you were prescribed an anti-parasitic medicine, take it as told by your health care provider. Do not stop taking Peggy anti-parasitic medicine even if you start to feel better. General instructions  Wash your hands often with soap and water for at least 20 seconds. If soap and water are not available, use hand sanitizer. Keep your nails short, and do not bite your nails. Change clothing and underwear daily. Wash bedding, pajamas, underwear, and towels in hot water after each use until pinworms are gone. Do not scratch Peggy skin around Peggy anus. Take a shower instead of a bath until Peggy infection is gone. Keep all follow-up visits. This is important. How is this prevented? Make sure that all members of your household wash their hands often to prevent Peggy spread of infection. Keep nails trimmed, avoid biting them, and do not scratch around Peggy anus. Wash clothing and bedding every morning in hot water. Vacuum rugs and floors to try to eliminate eggs. Avoid oral-anal contact during sex. Shower every morning to help remove eggs. Maintain sanitary habits as recurrence is common. Where to find more information Centers for Disease Control and Prevention: http://www.wolf.info/ Contact a health care provider if: You have new symptoms. You do not get better with treatment. Your symptoms get worse. Summary Pinworm infection can occur in  children who come in contact with many other people, such as at a daycare or school, and in people who live in long-term care facilities. After pinworm eggs are swallowed, they grow in Peggy intestine. Peggy worms travel out of Peggy anus and  lay eggs in that area at night. Peggy most common symptoms of infection are itching around Peggy anus, difficulty sleeping, and restlessness. Peggy best way to control Peggy spread of infection is by washing hands often, keeping nails trimmed, changing clothing and underwear daily, and washing bedding and towels in hot water after each use. This information is not intended to replace advice given to you by your health care provider. Make sure you discuss any questions you have with your health care provider. Document Revised: 04/26/2020 Document Reviewed: 04/26/2020 Elsevier Patient Education  2023 [employer]. Oxiuros Pinworms Los oxiuros son un tipo de parsito que causa una infeccin intestinal frecuente. Se trata de pequeos gusanos blancos que se trasmiten muy fcilmente de Peggy Monroe persona a Peggy Monroe (son contagiosos). Cules son las causas? La causa de esta enfermedad es tragar huevos de un oxiuro. Los Peggy Monroe se Engineer, maintenance (IT) en bebidas o alimentos infectados (contaminados); o en las manos, los juguetes o la ropa. Tras ser ingeridos, los huevos se incuban en los intestinos. Cuando crecen y Peggy Monroe, las hembras se desplazan fuera de la abertura que est entre las nalgas (el ano) y ponen huevos en la zona anal durante la noche. Estos huevos luego contaminan todo aquello con lo que tienen contacto, como la piel, la ropa, las toallas y la ropa de Peggy Monroe. De este modo contina el ciclo infeccioso. Qu incrementa el riesgo? Es ms probable que Peggy Monroe se presente en los nios que tienen contacto con muchas otras personas, por ejemplo, en una guardera o en la escuela, y en las personas que viven en centros de atencin prolongada. Se puede transmitir a familiares u otros cuidadores. Cules son los signos o sntomas? Los sntomas de esta afeccin incluyen: Peggy Monroe ano, o en la zona alrededor del ano, especialmente a la noche. Dificultad para dormir e inquietud. Nuseas o dolor en el abdomen. Mojar  la cama. Dificultad para orinar. Secrecin o picazn vaginal. En algunos casos, no hay sntomas. En casos Peggy Monroe frecuentes, las reacciones alrgicas o el traslado de los gusanos hacia otras partes del cuerpo podran causar problemas; entre ellos, dolor, otras infecciones o inflamacin. Cmo se diagnostica? Esta afeccin se diagnostica en funcin de sus antecedentes mdicos y de un examen fsico. El mdico podra pedirle que se coloque un trozo de Peggy Monroe en la zona anal por la maana, antes de usar el bao. Los huevos quedarn adheridos a Peggy Monroe, Peggy Monroe. Luego, el mdico observar la cinta con un microscopio para Peggy Monroe. Cmo se trata? El tratamiento de esta afeccin puede incluir: Medicamentos antiparasitarios orales para Peggy Monroe oxiuros. Medicamentos tpicos para Peggy Monroe, como gel de vaselina. El mdico podra recomendarle que todos aquellos que conviven con el hogar y sus cuidadores tambin reciban tratamiento contra los oxiuros. Siga estas instrucciones en su casa: Medicamentos [place] o aplquese los medicamentos de venta libre y los recetados solamente como se lo haya indicado el mdico. Si le recetaron un antiparasitario, tmelo como se lo haya indicado el mdico. No deje de tomar el antiparasitario aunque comience a sentirse mejor. Instrucciones generales  Lvese las manos frecuentemente con agua y jabn durante al menos 20 segundos. Use desinfectante para manos si no dispone de Peggy Monroe. Mantngase  las uas cortas y no se las [place]. Cmbiese de ropa interior y ropa exterior a diario. Lave la ropa de Peggy Monroe, los pijamas, la ropa interior y las toallas en agua caliente despus de cada uso hasta que los oxiuros hayan desaparecido. No se rasque la piel que rodea al ano. Tome duchas en lugar de baos de inmersin hasta que la infeccin desaparezca. Peggy Monroe. Esto es importante. Cmo se previene? Es importante que  todas las personas que conviven en la casa se laven las manos con frecuencia para evitar que la infeccin se propague. Mantngase las uas cortas, evite mordrselas y no se rasque alrededor del ano. Lave la ropa y la ropa de cama todas las maanas con agua caliente. Aspire las alfombras y los pisos para intentar eliminar los [place]. Evite el contacto oral-anal [place]. Dchese todas las maanas para ayudar a Peggy Monroe. Mantenga los hbitos sanitarios, ya que la recurrencia es frecuente. Dnde obtener ms informacin Centers for Disease Control and Prevention (Centros para el Control y la Prevencin de Arboriculturist): http://www.wolf.info/ Comunquese con un mdico si: Aparecen nuevos sntomas. No mejora con el tratamiento. Sus sntomas empeoran. Resumen La infeccin por oxiuros puede ocurrir en los nios que tienen contacto con muchas otras personas, por ejemplo, en una guardera o en la escuela, y en las personas que viven en centros de atencin prolongada. Despus de tragar los huevos de oxiuros, estos crecen en el intestino. Los gusanos se trasladan [employer] exterior del ano y ponen huevos en esa zona durante la noche. Algunos de los sntomas ms frecuentes de la infeccin son picazn en la zona anal, dificultad para dormir y nerviosismo. El mejor modo de Chief Technology Officer la propagacin de la infeccin es lavarse las manos con frecuencia, Theatre manager las uas cortas, Agricultural engineer ropa exterior y Engineer, materials ropa interior [place] y [place] sbanas y toallas con agua caliente despus de cada uso. Esta informacin no tiene Marine scientist el consejo del mdico. Asegrese de hacerle al mdico cualquier pregunta que tenga. Document Revised: 05/10/2020 Document Reviewed: 05/10/2020 Elsevier Patient Education  [place].

## 2022-03-30 NOTE — Progress Notes (Signed)
Patient ID: Peggy Monroe, female   DOB: 1980/12/02, 41 y.o.   MRN: [id number]     Peggy Monroe, is a 41 y.o. female  [id number]  [id number]  DOB - June 17, 1981  Chief Complaint  Patient presents with   Body chills     Patient c/o Hot and cold feeling and itching    Fatigue       Subjective:   Peggy Monroe is a 41 y.o. female here today at the mobile clinic at [facility] in [place].  Sometimes she feels really hot and really cold for the last week.  She also had a little stomach upset yesterday and had to take 2 omeprazole to calm it down.  She has been having anal itching that is severe at night.  No one else in the house with symptoms.  She has not checked her temp.  No N/V/D.  No constipation.  No sneezing/cough, but she is having some post nasal drip.     No problems updated.  ALLERGIES: No Known Allergies  PAST MEDICAL HISTORY: Past Medical History:  Diagnosis Date   Asthma    HLD (hyperlipidemia)    Recurrent upper respiratory infection (URI)    Shortness of breath    " with my asthma"    MEDICATIONS AT HOME: Prior to Admission medications   Medication Sig Start Date End Date Taking? Authorizing Provider  cetirizine (ZYRTEC) 10 MG tablet Take 1 tablet (10 mg total) by mouth daily. 03/30/22  Yes Argentina Donovan, PA-C  gabapentin (NEURONTIN) 300 MG capsule Take 1 capsule (300 mg total) by mouth at bedtime. Prn numbness 01/21/20  Yes Arlee Bossard M, PA-C  hydrocortisone (ANUSOL-HC) 25 MG suppository Place 1 suppository (25 mg total) rectally 2 (two) times daily. Prn itching 03/30/22  Yes Tynan Boesel M, PA-C  meloxicam (MOBIC) 7.5 MG tablet Take 1 tablet (7.5 mg total) by mouth daily. 08/09/21  Yes Charlott Rakes, MD  prochlorperazine (COMPAZINE) 10 MG tablet Take 1 tablet (10 mg total) by mouth every 8 (eight) hours as needed for nausea or vomiting (headache). 09/10/19  Yes Fulp, Cammie, MD  pyrantel pamoate  50 MG/ML SUSP Take 16.76 mLs (838 mg total) by mouth once for 1 dose. Repeat dose in 10 days 03/30/22 03/30/22 Yes Emersyn Wyss, Dionne Bucy, PA-C  Vitamin D, Ergocalciferol, (DRISDOL) 1.25 MG (50000 UNIT) CAPS capsule Take 1 capsule (50,000 Units total) by mouth every 7 (seven) days. 01/06/21  Yes Henok Heacock M, PA-C  albuterol (VENTOLIN HFA) 108 (90 Base) MCG/ACT inhaler INHALE 2 PUFFS INTO THE LUNGS EVERY 6 (SIX) HOURS AS NEEDED. SHORTNESS OF BREATH 07/21/20 07/21/21  Charlott Rakes, MD    ROS: Neg HEENT Neg resp Neg cardiac Neg MS Neg psych Neg neuro  Objective:   Vitals:   03/30/22 1556 03/30/22 1557  BP: (!) 100/56 (!) 100/56  Weight: 168 lb (76.2 kg)   Height: 5' 1.02" (1.55 m)    Exam General appearance : Awake, alert, not in any distress. Speech Clear. Not toxic looking HEENT: Atraumatic and Normocephalic, throat with PND Neck: Supple, no JVD. No cervical lymphadenopathy.  Chest: Good air entry bilaterally, CTAB.  No rales/rhonchi/wheezing CVS: S1 S2 regular, no murmurs.  Extremities: B/L Lower Ext shows no edema, both legs are warm to touch Neurology: Awake alert, and oriented X 3, CN II-XII intact, Non focal Skin: No Rash  Data Review Lab Results  Component Value Date   HGBA1C 5.4 09/10/2019    Assessment &  Plan   1. Chills - Comprehensive metabolic panel - CBC with Differential  2. Body aches - Comprehensive metabolic panel - TSH - CBC with Differential  3. Anal itching - Comprehensive metabolic panel - hydrocortisone (ANUSOL-HC) 25 MG suppository; Place 1 suppository (25 mg total) rectally 2 (two) times daily. Prn itching  Dispense: 12 suppository; Refill: 0 - pyrantel pamoate 50 MG/ML SUSP; Take 16.76 mLs (838 mg total) by mouth once for 1 dose. Repeat dose in 10 days  Dispense: 15 mL; Refill: 0  4. Pruritus - Comprehensive metabolic panel - cetirizine (ZYRTEC) 10 MG tablet; Take 1 tablet (10 mg total) by mouth daily.  Dispense: 30 tablet; Refill:  11    Return if symptoms worsen or fail to improve.  The patient was given clear instructions to go to ER or return to medical center if symptoms don't improve, worsen or new problems develop. The patient verbalized understanding. The patient was told to call to get lab results if they haven't heard anything in the next week.      Georgian Co, PA-C [facility] [place], [place] [phone]   03/30/2022, 4:20 PM

## 2022-03-31 LAB — CBC WITH DIFFERENTIAL/PLATELET
Basophils Absolute: 0.1 10*3/uL (ref 0.0–0.2)
Basos: 1 %
EOS (ABSOLUTE): 0.1 10*3/uL (ref 0.0–0.4)
Eos: 1 %
Hematocrit: 37.5 % (ref 34.0–46.6)
Hemoglobin: 12.9 g/dL (ref 11.1–15.9)
Immature Grans (Abs): 0 10*3/uL (ref 0.0–0.1)
Immature Granulocytes: 0 %
Lymphocytes Absolute: 2.3 10*3/uL (ref 0.7–3.1)
Lymphs: 27 %
MCH: 32.6 pg (ref 26.6–33.0)
MCHC: 34.4 g/dL (ref 31.5–35.7)
MCV: 95 fL (ref 79–97)
Monocytes Absolute: 0.7 10*3/uL (ref 0.1–0.9)
Monocytes: 8 %
Neutrophils Absolute: 5.4 10*3/uL (ref 1.4–7.0)
Neutrophils: 63 %
Platelets: 144 10*3/uL — ABNORMAL LOW (ref 150–450)
RBC: 3.96 x10E6/uL (ref 3.77–5.28)
RDW: 12.4 % (ref 11.7–15.4)
WBC: 8.5 10*3/uL (ref 3.4–10.8)

## 2022-03-31 LAB — COMPREHENSIVE METABOLIC PANEL
ALT: 24 IU/L (ref 0–32)
AST: 18 IU/L (ref 0–40)
Albumin/Globulin Ratio: 1.8 (ref 1.2–2.2)
Albumin: 4.4 g/dL (ref 3.9–4.9)
Alkaline Phosphatase: 68 IU/L (ref 44–121)
BUN/Creatinine Ratio: 16 (ref 9–23)
BUN: 9 mg/dL (ref 6–24)
Bilirubin Total: 0.2 mg/dL (ref 0.0–1.2)
CO2: 19 mmol/L — ABNORMAL LOW (ref 20–29)
Calcium: 9 mg/dL (ref 8.7–10.2)
Chloride: 105 mmol/L (ref 96–106)
Creatinine, Ser: 0.55 mg/dL — ABNORMAL LOW (ref 0.57–1.00)
Globulin, Total: 2.4 g/dL (ref 1.5–4.5)
Glucose: 84 mg/dL (ref 70–99)
Potassium: 4.1 mmol/L (ref 3.5–5.2)
Sodium: 140 mmol/L (ref 134–144)
Total Protein: 6.8 g/dL (ref 6.0–8.5)
eGFR: 117 mL/min/{1.73_m2} (ref >59)

## 2022-03-31 LAB — TSH: TSH: 1.5 u[IU]/mL (ref 0.450–4.500)

## 2022-04-10 ENCOUNTER — Telehealth: Payer: Self-pay | Admitting: Family Medicine

## 2022-04-10 NOTE — Telephone Encounter (Signed)
Copied from [place] [id number]. Topic: General - Other >> Apr 10, 2022 11:05 AM Everette C wrote: Reason for CRM: The patient would like to be contacted by a member of staff regarding their lab results from 03/30/22  The patient would like to speak with a member of staff further when possible  Please contact further when available

## 2022-04-11 ENCOUNTER — Telehealth: Payer: Self-pay

## 2022-04-11 NOTE — Telephone Encounter (Signed)
Interpretor ID: [id number], Called and Lvm for patient to return call for recent lab results

## 2022-04-11 NOTE — Telephone Encounter (Signed)
Interpreter ID: [id number], Left Vm for patient to return call for recent lab results

## 2022-04-11 NOTE — Telephone Encounter (Signed)
Routing to CMA 

## 2022-04-20 ENCOUNTER — Ambulatory Visit: Payer: Self-pay | Admitting: *Deleted

## 2022-04-20 DIAGNOSIS — N939 Abnormal uterine and vaginal bleeding, unspecified: Secondary | ICD-10-CM

## 2022-04-20 NOTE — Telephone Encounter (Signed)
Chief Complaint: lightheaded at times, heavy menstrual bleeding requesting appt Symptoms: lightheaded. Not as much now sometimes has to stop working due to lightheadedness. Started period 04/08/22 until now. Flow now less black in color. Changes pads approx 1 daily. Reports getting blood drawn in clinic and would like to know if she needs to come back for more lab work. C/o abdominal pain at times .  Frequency: 6 months  Pertinent Negatives: Patient denies worsening lightheadedness. No c/o or report of passing out . Does have to stop working at times due to feeling worse weakness and lightheadedness Disposition: [] ED /[] Urgent Care (no appt availability in office) / [x] Appointment(In office/virtual)/ []  [facility]/ [] Home Care/ [] Refused Recommended Disposition /[] [facility]/ []  Follow-up with PCP Additional Notes:   Appt scheduled 05/24/22. Recommended UC if sx worsen.  Please advise    Reason for Disposition  Periods last > 7 days  Answer Assessment - Initial Assessment Questions 1. AMOUNT: "Describe the bleeding that you are having."    - SPOTTING: spotting, or pinkish / brownish mucous discharge; does not fill panty liner or pad    - MILD:  less than 1 pad / hour; less than patient's usual menstrual bleeding   - MODERATE: 1-2 pads / hour; 1 menstrual cup every 6 hours; small-medium blood clots (e.g., pea, grape, small coin)   - SEVERE: soaking 2 or more pads/hour for 2 or more hours; 1 menstrual cup every 2 hours; bleeding not contained by pads or continuous red blood from vagina; large blood clots (e.g., golf ball, large coin)      Started period 04/08/22 until now . 1 st 5 days soaking pads every 2 hours now changing pads approx. 1 day spotting discharge looks black  2. ONSET: "When did the bleeding begin?" "Is it continuing now?"     04/08/22 until now  3. MENSTRUAL PERIOD: "When was the last normal menstrual period?" "How is this different than your period?"      Na, irregular periods x 6 months  4. REGULARITY: "How regular are your periods?"     na 5. ABDOMEN PAIN: "Do you have any pain?" "How bad is the pain?"  (e.g., Scale 1-10; mild, moderate, or severe)   - MILD (1-3): doesn't interfere with normal activities, abdomen soft and not tender to touch    - MODERATE (4-7): interferes with normal activities or awakens from sleep, abdomen tender to touch    - SEVERE (8-10): excruciating pain, doubled over, unable to do any normal activities      Yes , tender to touch sometimes . Can't sleep sometimes  6. PREGNANCY: "Is there any chance you are pregnant?" "When was your last menstrual period?"     LMP now  7. BREASTFEEDING: "Are you breastfeeding?"     na 8. HORMONE MEDICINES: "Are you taking any hormone medicines, prescription or over-the-counter?" (e.g., birth control pills, estrogen)     na 9. BLOOD THINNER MEDICINES: "Do you take any blood thinners?" (e.g., Coumadin / warfarin, Pradaxa / dabigatran, aspirin)     na 10. CAUSE: "What do you think is causing the bleeding?" (e.g., recent gyn surgery, recent gyn procedure; known bleeding disorder, cervical cancer, polycystic ovarian disease, fibroids)         Not sure has been seen by PCP for issue 11. HEMODYNAMIC STATUS: "Are you weak or feeling lightheaded?" If Yes, ask: "Can you stand and walk normally?"        Lightheaded at times not as  much now  12. OTHER SYMPTOMS: "What other symptoms are you having with the bleeding?" (e.g., passed tissue, vaginal discharge, fever, menstrual-type cramps)       Abdominal pain , lightheaded. Can work but sometimes has times she has to stop working due to feeling weak at times  Protocols used: Vaginal Bleeding - Abnormal-A-AH

## 2022-04-21 ENCOUNTER — Telehealth: Payer: Self-pay | Admitting: Emergency Medicine

## 2022-04-21 NOTE — Addendum Note (Signed)
Addended by: Charlott Rakes on: 04/21/2022 11:23 AM   Modules accepted: Orders

## 2022-04-21 NOTE — Telephone Encounter (Signed)
Copied from [place] [id number]. Topic: General - Other >> Apr 21, 2022  1:46 PM Leitha Schuller wrote: Pt returning nurses call  Please fu w/ pt

## 2022-04-21 NOTE — Telephone Encounter (Signed)
Pt returning phone call ° °

## 2022-04-21 NOTE — Telephone Encounter (Signed)
Left message on voicemail to return call.

## 2022-04-21 NOTE — Telephone Encounter (Signed)
Scheduled an apt for patient for MRI.   Advised to call [facility] [employer] [facility]. Given phone number for patient to call.

## 2022-04-21 NOTE — Telephone Encounter (Signed)
I referred her to GYN after her last visit with me in 07/2021 due to abnormal pelvic ultrasound.  Please provide her with the number to contact them to schedule an appointment as GYN referral note states 'unable to schedule'.  I also ordered MRI which she never underwent.  I have also ordered CBC which she needs to undergo prior to her upcoming appointment.

## 2022-04-24 NOTE — Telephone Encounter (Signed)
Patient was called to give results to lab test. Call should have been sent to [facility] RN to assist patient since CRM was place  Called patient to give most recent lab results - Please call patient. Kidney function is slightly impaired. Drink more water. Platelets are borederline low-avoid alcohol and aspirin. No diabetes. Liver function, electrolytes, thyroid are normal. Follow up as planned. Thanks, Freeman Caldron, PA-C   Interpreter assistance provided by [employer], [place],ID # [phone].

## 2022-05-01 ENCOUNTER — Telehealth: Payer: Self-pay | Admitting: Family Medicine

## 2022-05-01 NOTE — Telephone Encounter (Signed)
Patient called in, has MR

## 2022-05-01 NOTE — Telephone Encounter (Signed)
Copied from CRM [id number]. Topic: Appointment Scheduling - Scheduling Inquiry for Clinic >> May 01, 2022  4:21 PM [place] B wrote: Reason for CRM: Patient called in wants to know why she has to have another MRI done of abdomen when she already had one done previoiusly.

## 2022-05-02 NOTE — Telephone Encounter (Signed)
Pt was called and informed that she had an [place] in the past not a MRI.

## 2022-05-03 ENCOUNTER — Ambulatory Visit (HOSPITAL_COMMUNITY): Payer: Self-pay | Attending: Family Medicine

## 2022-05-08 ENCOUNTER — Ambulatory Visit (INDEPENDENT_AMBULATORY_CARE_PROVIDER_SITE_OTHER): Payer: Self-pay | Admitting: Nurse Practitioner

## 2022-05-08 ENCOUNTER — Encounter: Payer: Self-pay | Admitting: Nurse Practitioner

## 2022-05-08 VITALS — BP 99/63 | HR 71 | Temp 97.5°F | Ht 63.0 in | Wt 169.4 lb

## 2022-05-08 DIAGNOSIS — E559 Vitamin D deficiency, unspecified: Secondary | ICD-10-CM

## 2022-05-08 DIAGNOSIS — F341 Dysthymic disorder: Secondary | ICD-10-CM

## 2022-05-08 DIAGNOSIS — D696 Thrombocytopenia, unspecified: Secondary | ICD-10-CM

## 2022-05-08 DIAGNOSIS — N838 Other noninflammatory disorders of ovary, fallopian tube and broad ligament: Secondary | ICD-10-CM

## 2022-05-08 DIAGNOSIS — F32A Depression, unspecified: Secondary | ICD-10-CM | POA: Insufficient documentation

## 2022-05-08 DIAGNOSIS — N939 Abnormal uterine and vaginal bleeding, unspecified: Secondary | ICD-10-CM

## 2022-05-08 DIAGNOSIS — R5383 Other fatigue: Secondary | ICD-10-CM

## 2022-05-08 MED ORDER — ESCITALOPRAM OXALATE 10 MG PO TABS: 10.0000 mg | ORAL_TABLET | Freq: Every day | ORAL | 0 refills | Status: DC

## 2022-05-08 NOTE — Patient Instructions (Addendum)
    Abnormal uterine bleeding  - Ambulatory referral to Gynecology  Other depression  - escitalopram (LEXAPRO) 10 MG tablet; Take 1 tablet (10 mg total) by mouth daily.  Dispense: 90 tablet; Refill: 0   It is important that you exercise regularly at least 30 minutes 5 times a week as tolerated  Think about what you will eat, plan ahead. Choose " clean, green, fresh or frozen" over canned, processed or packaged foods which are more sugary, salty and fatty. 70 to 75% of food eaten should be vegetables and fruit. Three meals at set times with snacks allowed between meals, but they must be fruit or vegetables. Aim to eat over a 12 hour period , example 7 am to 7 pm, and STOP after  your last meal of the day. Drink water,generally about 64 ounces per day, no other drink is as healthy. Fruit juice is best enjoyed in a healthy way, by EATING the fruit.  Thanks for choosing [facility] we consider it a privelige to serve you.

## 2022-05-08 NOTE — Assessment & Plan Note (Signed)
Lab Results  Component Value Date   WBC 8.5 03/30/2022   HGB 12.9 03/30/2022   HCT 37.5 03/30/2022   MCV 95 03/30/2022   PLT 144 (L) 03/30/2022   Plt slightly decreased on recent labs. Patient denies any other abnormal bleeding apart from her uterine bleeding, she denies recheck of her her plts counts today and I think that this is reasonable to recheck labs later.

## 2022-05-08 NOTE — Progress Notes (Signed)
Established Patient Office Visit  Subjective:  Patient ID: Peggy Monroe, female    DOB: 22-Apr-1981  Age: 41 y.o. MRN: [id number]  CC:  Chief Complaint  Patient presents with   abnormal menstual     HPI Peggy Monroe is a 41 y.o. female with past medical history of insomnia, depression, anxiety , hypercholesteremia, abnormal uterine bleeding who presents for follow up for her abnormal uterine bleeding. Patient of [place] , MD   She complained of  heavy menstrual  bleeding, passing blood clots for the past 8 months. Menstrual period last 7 -13 days, has spotting's in between. Has Intermittent cramps, intensity of the cramp varies. She has not taken any medications for her cramps. Her cramps is associated with nausea. She denies fever, chills, constipation, vomiting. MRI of the abdomen was ordered by her PCP due to abnormal pelvic US concerning for uterine mass. She stated that the MRI was not done by the radiologist because she was on her menstrual period at that the time she presented for the MRI.    Depression. Depressed from having low energy , she was told that she had a low platelet count, feels sad always. Never been on medication for depression.  She would like medication and therapy  for depression. She denies SI, HI. Past Medical History:  Diagnosis Date   Asthma    HLD (hyperlipidemia)    Recurrent upper respiratory infection (URI)    Shortness of breath    " with my asthma"    Past Surgical History:  Procedure Laterality Date   CESAREAN SECTION     ELBOW SURGERY Left At age 88   TONSILLECTOMY      Family History  Problem Relation Age of Onset   Colon cancer Father    Diabetes Maternal Grandmother    Diabetes Paternal Grandmother    Esophageal cancer Neg Hx    Rectal cancer Neg Hx    Stomach cancer Neg Hx     Social History   Socioeconomic History   Marital status: Legally Separated    Spouse name: Not on file   Number of children:  Not on file   Years of education: Not on file   Highest education level: Not on file  Occupational History   Not on file  Tobacco Use   Smoking status: Never   Smokeless tobacco: Never  Vaping Use   Vaping Use: Never used  Substance and Sexual Activity   Alcohol use: No   Drug use: No   Sexual activity: Not Currently    Birth control/protection: None  Other Topics Concern   Not on file  Social History Narrative   Not on file   Social Determinants of Health   Financial Resource Strain: Not on file  Food Insecurity: Not on file  Transportation Needs: Not on file  Physical Activity: Not on file  Stress: Not on file  Social Connections: Not on file  Intimate Partner Violence: Not on file    Outpatient Medications Prior to Visit  Medication Sig Dispense Refill   CALCIUM PO Take by mouth.     albuterol (VENTOLIN HFA) 108 (90 Base) MCG/ACT inhaler INHALE 2 PUFFS INTO THE LUNGS EVERY 6 (SIX) HOURS AS NEEDED. SHORTNESS OF BREATH (Patient not taking: Reported on 05/08/2022) 18 g 0   cetirizine (ZYRTEC) 10 MG tablet Take 1 tablet (10 mg total) by mouth daily. (Patient not taking: Reported on 05/08/2022) 30 tablet 11   gabapentin (NEURONTIN) 300 MG  capsule Take 1 capsule (300 mg total) by mouth at bedtime. Prn numbness (Patient not taking: Reported on 05/08/2022) 30 capsule 5   hydrocortisone (ANUSOL-HC) 25 MG suppository Place 1 suppository (25 mg total) rectally 2 (two) times daily. Prn itching (Patient not taking: Reported on 05/08/2022) 12 suppository 0   meloxicam (MOBIC) 7.5 MG tablet Take 1 tablet (7.5 mg total) by mouth daily. (Patient not taking: Reported on 05/08/2022) 30 tablet 1   prochlorperazine (COMPAZINE) 10 MG tablet Take 1 tablet (10 mg total) by mouth every 8 (eight) hours as needed for nausea or vomiting (headache). (Patient not taking: Reported on 05/08/2022) 10 tablet 3   Vitamin D, Ergocalciferol, (DRISDOL) 1.25 MG (50000 UNIT) CAPS capsule Take 1 capsule (50,000  Units total) by mouth every 7 (seven) days. (Patient not taking: Reported on 05/08/2022) 16 capsule 0   No facility-administered medications prior to visit.    No Known Allergies  ROS Review of Systems  Constitutional:  Negative for activity change, appetite change, chills, diaphoresis and fatigue.  Respiratory:  Negative for apnea, chest tightness, shortness of breath and wheezing.   Cardiovascular: Negative.  Negative for chest pain, palpitations and leg swelling.  Gastrointestinal:  Positive for abdominal pain and nausea. Negative for anal bleeding, blood in stool, constipation and diarrhea.  Genitourinary:  Positive for menstrual problem, pelvic pain and vaginal bleeding. Negative for difficulty urinating, dyspareunia, dysuria, enuresis and flank pain.  Psychiatric/Behavioral:  Negative for hallucinations, self-injury and suicidal ideas. The patient is nervous/anxious. The patient is not hyperactive.       Objective:    Physical Exam Constitutional:      General: She is not in acute distress.    Appearance: She is not ill-appearing, toxic-appearing or diaphoretic.  Cardiovascular:     Rate and Rhythm: Normal rate and regular rhythm.     Pulses: Normal pulses.     Heart sounds: Normal heart sounds. No murmur heard.    No friction rub. No gallop.  Pulmonary:     Effort: Pulmonary effort is normal. No respiratory distress.     Breath sounds: Normal breath sounds. No stridor. No wheezing, rhonchi or rales.  Chest:     Chest wall: No tenderness.  Abdominal:     Palpations: There is no mass.     Tenderness: There is abdominal tenderness. There is no right CVA tenderness, left CVA tenderness, guarding or rebound.     Comments: All 4 quadrants  Musculoskeletal:        General: No swelling, tenderness, deformity or signs of injury.     Right lower leg: No edema.     Left lower leg: No edema.  Skin:    General: Skin is warm and dry.     Coloration: Skin is not jaundiced.      Findings: No bruising or lesion.  Neurological:     Mental Status: She is alert.     Cranial Nerves: No cranial nerve deficit.     Motor: No weakness.     Coordination: Coordination normal.     Gait: Gait normal.  Psychiatric:        Mood and Affect: Mood normal.        Behavior: Behavior normal.        Thought Content: Thought content normal.        Judgment: Judgment normal.     BP 99/63   Pulse 71   Temp (!) 97.5 F (36.4 C)   Ht _0  (1.6 m)  Wt 169 lb 6.4 oz (76.8 kg)   SpO2 100%   BMI 30.01 kg/m  Wt Readings from Last 3 Encounters:  05/08/22 169 lb 6.4 oz (76.8 kg)  03/30/22 168 lb (76.2 kg)  08/09/21 166 lb 3.2 oz (75.4 kg)    Lab Results  Component Value Date   TSH 1.500 03/30/2022   Lab Results  Component Value Date   WBC 8.5 03/30/2022   HGB 12.9 03/30/2022   HCT 37.5 03/30/2022   MCV 95 03/30/2022   PLT 144 (L) 03/30/2022   Lab Results  Component Value Date   NA 140 03/30/2022   K 4.1 03/30/2022   CO2 19 (L) 03/30/2022   GLUCOSE 84 03/30/2022   BUN 9 03/30/2022   CREATININE 0.55 (L) 03/30/2022   BILITOT 0.2 03/30/2022   ALKPHOS 68 03/30/2022   AST 18 03/30/2022   ALT 24 03/30/2022   PROT 6.8 03/30/2022   ALBUMIN 4.4 03/30/2022   CALCIUM 9.0 03/30/2022   ANIONGAP 7 05/26/2021   EGFR 117 03/30/2022   Lab Results  Component Value Date   CHOL 211 (H) 01/21/2020   Lab Results  Component Value Date   HDL 45 01/21/2020   Lab Results  Component Value Date   LDLCALC 138 (H) 01/21/2020   Lab Results  Component Value Date   TRIG 156 (H) 01/21/2020   Lab Results  Component Value Date   CHOLHDL 4.7 (H) 01/21/2020   Lab Results  Component Value Date   HGBA1C 5.4 09/10/2019      Assessment & Plan:   Problem List Items Addressed This Visit       Genitourinary   Abnormal uterine bleeding - Primary    Chronic condition, uterine mass, small uterine fibroid noted on previous pelvic US.  Mri ordered to further evaluate complex  cystic mass at the left adnexa  Patient referred to [place] as needed for her abdominal cramps.           Relevant Orders   Ambulatory referral to Gynecology   MR Abdomen W Wo Contrast     Other   ANXIETY DEPRESSION       05/08/2022    8:19 AM 03/30/2022    4:00 PM 08/09/2021    1:45 PM 12/15/2019    4:29 PM 09/10/2019    2:30 PM  Depression screen PHQ 2/9  Decreased Interest 1 1 0 0 0  Down, Depressed, Hopeless _0 0 1  PHQ - 2 Score _1 0 1  Altered sleeping 2 2 0 0 1  Tired, decreased energy _2 0 1  Change in appetite 1 0 0 0 0  Feeling bad or failure about yourself  1 2 0 0 1  Trouble concentrating _3 0 0  Moving slowly or fidgety/restless 3 0 0 0 0  Suicidal thoughts 0 0 0 0 0  PHQ-9 Score _4 0 4  Difficult doing work/chores Very difficult    Somewhat difficult       05/08/2022    9:04 AM 03/30/2022    4:00 PM 12/15/2019    4:29 PM 09/10/2019    2:30 PM  GAD 7 : Generalized Anxiety Score  Nervous, Anxious, on Edge 0 0 0 1  Control/stop worrying 1 0 0 0  Worry too much - different things 1 0 0 1  Trouble relaxing 1 0 0 0  Restless 1 0 0 0  Easily annoyed or irritable 0 0 0 1  Afraid - awful might happen 1 0 0 0  Total GAD 7 Score 5 0 0 3  Anxiety Difficulty Somewhat difficult Not difficult at all  Somewhat difficult  Start lexapro 49m daily  Need to take medication consistently  discussed.  Follow up in 56 weeks  Patient referred to LCSW for therapy.       Relevant Medications   escitalopram (LEXAPRO) 10 MG tablet   Other Relevant Orders   Ambulatory referral to Gynecology   Ovarian mass, left    Pelvic UKoreadone in Feb 2023.   Heterogeneous complex cystic area at the left adnexa is indeterminate for complex ovarian mass versus hydrosalpinx complicated by hemorrhage or infection. Surgical consultation is recommended with possible MRI evaluation. 2. Indeterminate but probably benign cyst in the right  ovary measuring up to 4.2 cm. Recommend 6-12 week sonographic follow-up 3. Normal premenopausal endometrial thickness 4. Small uterine fibroid  MRI ordered today  Patient referred to OBGYN, she will need a repeat sonographic follow up as well.       Relevant Orders   MR Abdomen W Wo Contrast   Low platelet count (HCC)    Lab Results  Component Value Date   WBC 8.5 03/30/2022   HGB 12.9 03/30/2022   HCT 37.5 03/30/2022   MCV 95 03/30/2022   PLT 144 (L) 03/30/2022   Plt slightly decreased on recent labs. Patient denies any other abnormal bleeding apart from her uterine bleeding, she denies recheck of her her plts counts today and I think that this is reasonable to recheck labs later.       Vitamin D deficiency    Check Vitamin D level      Relevant Orders   Vitamin D, 25-hydroxy   Other fatigue    Normal CBC, TSH CHECK vitamin D levels,  Patient encouraged to engage in regular moderate exercises at least 30 minutes 5 days a week.       Relevant Orders   Vitamin D, 25-hydroxy    Meds ordered this encounter  Medications   escitalopram (LEXAPRO) 10 MG tablet    Sig: Take 1 tablet (10 mg total) by mouth daily.    Dispense:  90 tablet    Refill:  0    Follow-up: Return in about 6 weeks (around 06/19/2022) for depression.    FRenee Rival FNP

## 2022-05-08 NOTE — Assessment & Plan Note (Signed)
Pelvic US done in Feb 2023.   Heterogeneous complex cystic area at the left adnexa is indeterminate for complex ovarian mass versus hydrosalpinx complicated by hemorrhage or infection. Surgical consultation is recommended with possible MRI evaluation. 2. Indeterminate but probably benign cyst in the right ovary measuring up to 4.2 cm. Recommend 6-12 week sonographic follow-up 3. Normal premenopausal endometrial thickness 4. Small uterine fibroid  MRI ordered today  Patient referred to OBGYN, she will need a repeat sonographic follow up as well.

## 2022-05-08 NOTE — Assessment & Plan Note (Signed)
Normal CBC, TSH CHECK vitamin D levels,  Patient encouraged to engage in regular moderate exercises at least 30 minutes 5 days a week.

## 2022-05-08 NOTE — Assessment & Plan Note (Signed)
Check Vitamin D level 

## 2022-05-08 NOTE — Assessment & Plan Note (Addendum)
Chronic condition, uterine mass, small uterine fibroid noted on previous pelvic US.  Mri ordered to further evaluate complex cystic mass at the left adnexa  Patient referred to OBGYN  PATIENT ENCOURAGED TO TAKE TYLENOL as needed for her abdominal cramps.

## 2022-05-08 NOTE — Assessment & Plan Note (Addendum)
    05/08/2022    8:19 AM 03/30/2022    4:00 PM 08/09/2021    1:45 PM 12/15/2019    4:29 PM 09/10/2019    2:30 PM  Depression screen PHQ 2/9  Decreased Interest 1 1 0 0 0  Down, Depressed, Hopeless 1 2 1  0 1  PHQ - 2 Score 2 3 1  0 1  Altered sleeping 2 2 0 0 1  Tired, decreased energy 1 2 1  0 1  Change in appetite 1 0 0 0 0  Feeling bad or failure about yourself  1 2 0 0 1  Trouble concentrating 1 2 1  0 0  Moving slowly or fidgety/restless 3 0 0 0 0  Suicidal thoughts 0 0 0 0 0  PHQ-9 Score 11 11 3  0 4  Difficult doing work/chores Very difficult    Somewhat difficult       05/08/2022    9:04 AM 03/30/2022    4:00 PM 12/15/2019    4:29 PM 09/10/2019    2:30 PM  GAD 7 : Generalized Anxiety Score  Nervous, Anxious, on Edge 0 0 0 1  Control/stop worrying 1 0 0 0  Worry too much - different things 1 0 0 1  Trouble relaxing 1 0 0 0  Restless 1 0 0 0  Easily annoyed or irritable 0 0 0 1  Afraid - awful might happen 1 0 0 0  Total GAD 7 Score 5 0 0 3  Anxiety Difficulty Somewhat difficult Not difficult at all  Somewhat difficult   Start lexapro 10mg  daily  Need to take medication consistently  discussed.  Follow up in 56 weeks  Patient referred to LCSW for therapy.

## 2022-05-09 ENCOUNTER — Telehealth: Payer: Self-pay | Admitting: Clinical

## 2022-05-09 LAB — VITAMIN D 25 HYDROXY (VIT D DEFICIENCY, FRACTURES): Vit D, 25-Hydroxy: 19.3 ng/mL — ABNORMAL LOW (ref 30.0–100.0)

## 2022-05-09 NOTE — Telephone Encounter (Addendum)
Integrated Behavioral Health Case Management Referral Note  05/09/2022 Name: Peggy Monroe MRN: [id number] DOB: 1981-06-14 [name] Corey Harold is a 41 y.o. year old female who sees Hoy Register, MD for primary care. LCSW was consulted to assess patient's needs and assist the patient with Mental Health Counseling and Resources.  Interpreter: Yes.     Interpreter Name & Language: Spanish [phone]  Assessment: Patient experiencing mental health concerns. Referred by Edwin Dada after last visit.  Intervention: Called patient with interpreter. Advised patient of walk-in counseling services at [facility] of the [place] ([facility]), as they have a Spanish speaking counselor available there. Patient could not write the information down, as she was at work. She confirmed she could receive messages on MyChart, but it does not appear her MyChart is active, so CSW will mail this information to patient.   Review of patient status, including review of consultants reports, relevant laboratory and other test results, and collaboration with appropriate care team members and the patient's provider was performed as part of comprehensive patient evaluation and provision of services.    Abigail Butts, LCSW Patient Care Center [facility] [phone]

## 2022-05-09 NOTE — Progress Notes (Signed)
Vitamin D deficiency . Patient should take vitamin D, 1000 units daily , drink milk supplemented with vitamin D and get early morning sun. Get Med OTC.

## 2022-05-15 ENCOUNTER — Ambulatory Visit (HOSPITAL_COMMUNITY)
Admission: RE | Admit: 2022-05-15 | Discharge: 2022-05-15 | Disposition: A | Discharge status: Home / Self Care | Payer: Self-pay | Source: Ambulatory Visit | Attending: Nurse Practitioner | Admitting: Nurse Practitioner

## 2022-05-15 DIAGNOSIS — N838 Other noninflammatory disorders of ovary, fallopian tube and broad ligament: Secondary | ICD-10-CM

## 2022-05-15 DIAGNOSIS — N939 Abnormal uterine and vaginal bleeding, unspecified: Secondary | ICD-10-CM

## 2022-05-15 MED ORDER — GADOBUTROL 1 MMOL/ML IV SOLN
7.5000 mL | Freq: Once | INTRAVENOUS | Status: AC | PRN
  Administered 2022-05-15: 7.5 mL via INTRAVENOUS

## 2022-05-16 ENCOUNTER — Other Ambulatory Visit: Payer: Self-pay | Admitting: Nurse Practitioner

## 2022-05-16 DIAGNOSIS — N80129 Deep endometriosis of ovary, unspecified ovary: Secondary | ICD-10-CM

## 2022-05-16 MED ORDER — IBUPROFEN 600 MG PO TABS: 600.0000 mg | ORAL_TABLET | Freq: Three times a day (TID) | ORAL | 0 refills | Status: DC | PRN

## 2022-05-16 NOTE — Progress Notes (Signed)
MRI shows endometrioma, endometriosis and fibroid.   People with endometriosis can develop ovarian cysts containing endometriosis tissue; these are called endometriomas.  They are benign (not cancerous) but can cause pelvic pain; if this happens, surgery is usually recommended to remove them, she should take ibuprofen 600mg  every 8 hours  as needed for her pain and follow up with OBGYN as planned.

## 2022-05-19 ENCOUNTER — Telehealth: Payer: Self-pay | Admitting: Family Medicine

## 2022-05-19 NOTE — Telephone Encounter (Signed)
Copied from CRM [id number]. Topic: General - Other >> May 19, 2022  4:08 PM Lyman Speller wrote: Reason for CRM: Pt would like a call to go over Vit D and MRI results / please advise

## 2022-05-22 NOTE — Telephone Encounter (Signed)
Routing to CMA 

## 2022-05-24 ENCOUNTER — Ambulatory Visit: Payer: Self-pay | Admitting: Physician Assistant

## 2022-05-25 NOTE — Telephone Encounter (Signed)
Mailed pt results in her language. KH

## 2022-05-29 ENCOUNTER — Ambulatory Visit: Payer: Self-pay | Admitting: *Deleted

## 2022-05-29 NOTE — Telephone Encounter (Signed)
Pt calling back to follow up on labs and MRI results.  Pt is requesting a call back to discuss.  Please advise.

## 2022-05-29 NOTE — Telephone Encounter (Signed)
Opened chart to answer question for agent.   Pt calling in regarding lab results.   There isn't an interpretation of the results.  It's mentioned that a letter with the results in her language was mailed to her.  I wasn't able to assist with lab results in this situation.   The letter was in [place]. Agent to send message to [facility] and Wellness.   It also looks like the pt was possibly seen at the [facility] not [facility] but Dr. Alvis Lemmings is involved in the care.   Suggested agent forward it to [facility] for Dr. Alvis Lemmings.

## 2022-05-29 NOTE — Telephone Encounter (Signed)
Routing to CMA 

## 2022-05-30 NOTE — Telephone Encounter (Signed)
Lvm for pt to call back. 

## 2022-06-01 NOTE — Telephone Encounter (Signed)
Pt was advised Peggy Monroe 

## 2022-06-02 ENCOUNTER — Telehealth: Payer: Self-pay | Admitting: Clinical

## 2022-06-02 NOTE — Telephone Encounter (Signed)
Integrated Behavioral Health General Follow Up Note  06/02/2022 Name: Tyniah Kastens MRN: [id number] DOB: March 28, 1981 [name] Corey Harold is a 41 y.o. year old female who sees Hoy Register, MD for primary care. LCSW was initially consulted to assist the patient with Mental Health Counseling and Resources.   Interpreter: Yes.     Interpreter Name & Language: Humphrey Rolls [phone], Spanish  Assessment: Patient experiencing mental health concerns.   Ongoing Intervention: Today CSW called patient to follow up on referral to [facility] the [facility]) for mental health counseling. Mailed patient information, as she was at work and unable to write it last time. Reviewed with patient today and advised that [facility] provides free counseling and has a [place] Clinical cytogeneticist. Advised of walk-in hours. Patient acknowledged info and appreciative.   Review of patient status, including review of consultants reports, relevant laboratory and other test results, and collaboration with appropriate care team members and the patient's provider was performed as part of comprehensive patient evaluation and provision of services.    Abigail Butts, LCSW Patient Care Center [facility] [phone]

## 2022-06-20 ENCOUNTER — Ambulatory Visit: Payer: Self-pay | Admitting: Nurse Practitioner

## 2022-08-09 ENCOUNTER — Ambulatory Visit: Payer: Self-pay | Admitting: *Deleted

## 2022-08-09 NOTE — Telephone Encounter (Signed)
  Chief Complaint: Abdominal Pain Symptoms: Above navel towards left side, comes and goes, some days constant. "Have BM every time after I eat" Denies diarrhea "Normal" Bloating. Hungry all the time. Frequency: 4 days ago Pertinent Negatives: Patient denies fever, nausea, diarrhea Disposition: []$ ED /[]$ Urgent Care (no appt availability in office) / []$ Appointment(In office/virtual)/ []$  [place] [facility]/ []$ Home Care/ []$ Refused Recommended Disposition /[x]$ [place] [facility]/ []$  Follow-up with PCP Additional Notes: No availability at practice, advised [facility]. States she cannot go to site today but will look at site for next weeks schedule. Pt would like appt secured any ways, reiterated she needs to be seen sooner than first available, states will go to [facility]. Care advise provided, verbalizes understanding. Pt evasive historian, NT secured appt as asked, April 2,2024  Assited by Sammuel Cooper [phone] Reason for Disposition  [1] MODERATE pain (e.g., interferes with normal activities) AND [2] pain comes and goes (cramps) AND [3] present > 24 hours  (Exception: Pain with Vomiting or Diarrhea - see that Guideline.)  Answer Assessment - Initial Assessment Questions 1. LOCATION: "Where does it hurt?"      Above navel towards left 2. RADIATION: "Does the pain shoot anywhere else?" (e.g., chest, back)     To back 3. ONSET: "When did the pain begin?" (e.g., minutes, hours or days ago)      4 days ago 4. SUDDEN: "Gradual or sudden onset?"     NA 5. PATTERN "Does the pain come and go, or is it constant?"    - If it comes and goes: "How long does it last?" "Do you have pain now?"     (Note: Comes and goes means the pain is intermittent. It goes away completely between bouts.)    - If constant: "Is it getting better, staying the same, or getting worse?"      (Note: Constant means the pain never goes away completely; most serious pain is constant and gets worse.)       Constant 6. SEVERITY: "How bad is the pain?"  (e.g., Scale 1-10; mild, moderate, or severe)    - MILD (1-3): Doesn't interfere with normal activities, abdomen soft and not tender to touch.     - MODERATE (4-7): Interferes with normal activities or awakens from sleep, abdomen tender to touch.     - SEVERE (8-10): Excruciating pain, doubled over, unable to do any normal activities.       5/10 7. RECURRENT SYMPTOM: "Have you ever had this type of stomach pain before?" If Yes, ask: "When was the last time?" and "What happened that time?"      NA 8. CAUSE: "What do you think is causing the stomach pain?"     Unsure 9. RELIEVING/AGGRAVATING FACTORS: "What makes it better or worse?" (e.g., antacids, bending or twisting motion, bowel movement)     NA 10. OTHER SYMPTOMS: "Do you have any other symptoms?" (e.g., back pain, diarrhea, fever, urination pain, vomiting)       BM after eating, "Normal" feels hungry all the time, bloating  Protocols used: Abdominal Pain - [facility]

## 2022-09-19 ENCOUNTER — Encounter: Payer: Self-pay | Admitting: Nurse Practitioner

## 2022-09-19 ENCOUNTER — Ambulatory Visit: Payer: Self-pay | Attending: Nurse Practitioner | Admitting: Nurse Practitioner

## 2022-09-19 VITALS — BP 105/72 | HR 77 | Ht 63.0 in | Wt 168.8 lb

## 2022-09-19 DIAGNOSIS — N939 Abnormal uterine and vaginal bleeding, unspecified: Secondary | ICD-10-CM

## 2022-09-19 DIAGNOSIS — Z8659 Personal history of other mental and behavioral disorders: Secondary | ICD-10-CM

## 2022-09-19 DIAGNOSIS — R5382 Chronic fatigue, unspecified: Secondary | ICD-10-CM

## 2022-09-19 NOTE — Progress Notes (Signed)
[id number] Peggy Monroe Fatigue.  MRI results and next steps. Referral to [facility] already placed.

## 2022-09-19 NOTE — Patient Instructions (Addendum)
ENERGY FOODS HIGH PROTEIN 1. Oatmeal 2. Bananas 3. Greek Yogurt 4. Sweet Potatoes 5. Eggs 6. Beets 7. Almonds 8. Chia Seeds 9. Spinach 10. Quinoa 11. Blueberries 12. Hummus 13. Lentils 14. Beans 15. Dates   FOODS THAT DECREASE ENERGY Tortillas Beef Processed grains: Processed grains, like those found in white bread, white pasta, and white rice, are lower in fiber than their counterparts. While the difference is minimal, the processed grains may cause a more rapid rise and subsequent drop in blood sugar levels, affecting overall energy. Fried foods: [place] Pro foods are typically high in fat and low in fiber, often resulting in slowed digestion. When digestion is slowed, beneficial nutrients can't enter the body as efficiently, ultimately affecting energy. Foods with added sugar: Foods with added sugar typically cause a rapid rise in blood glucose levels, followed by a sugar crash.

## 2022-09-19 NOTE — Progress Notes (Signed)
Assessment & Plan:  Peggy Monroe was seen today for fatigue.  Diagnoses and all orders for this visit:  Chronic fatigue -     CMP14+EGFR -     CBC with Differential -     VITAMIN D 25 Hydroxy (Vit-D Deficiency, Fractures) -     Vitamin B12 -     Cortisol -     Aldosterone + renin activity w/ ratio  Abnormal uterine bleeding She is still waiting to hear from GYN. Unclear what happened to last referral -     Ambulatory referral to Gynecology  History of dysthymic disorder -     Ambulatory referral to Gynecology    Patient has been counseled on age-appropriate routine health concerns for screening and prevention. These are reviewed and up-to-date. Referrals have been placed accordingly. Immunizations are up-to-date or declined.    Subjective:   Chief Complaint  Patient presents with   Fatigue   HPI Peggy Monroe 42 y.o. female presents to office today with complaints of fatigue.   VRI was used to communicate directly with patient for the entire encounter including providing detailed patient instructions.    Fatigue Patient complains of fatigue. Symptoms began several months ago. Symptoms of her fatigue have been general malaise, hypersomnolence, and lack of interest in usual activities. Patient describes the following psychologic symptoms: none although she is taking lexapro which was prescribed by her PCP .  Patient denies significant change in weight, unusual rashes, cold intolerance, constipation and change in hair texture., and GI blood loss. Symptoms have progressed to a point and plateaued. Severity has been struggles to carry out day to day responsibilities.. Previous visits for this problem: yes, last seen a few months ago. Labs essentially normal.  She takes naps during the day. She sleeps through the night.  She does not exercise. She states she does not have any energy to exercise.      09/19/2022    1:47 PM 05/08/2022    8:19 AM 03/30/2022    4:00 PM   Depression screen PHQ 2/9  Decreased Interest 3 1 1   Down, Depressed, Hopeless 2 1 2   PHQ - 2 Score 5 2 3   Altered sleeping 0 2 2  Tired, decreased energy 2 1 2   Change in appetite 0 1 0  Feeling bad or failure about yourself  0 1 2  Trouble concentrating 1 1 2   Moving slowly or fidgety/restless 0 3 0  Suicidal thoughts 0 0 0  PHQ-9 Score 8 11 11   Difficult doing work/chores  Very difficult     Lab Results  Component Value Date   TSH 1.500 03/30/2022    Lab Results  Component Value Date   WBC 8.5 03/30/2022   HGB 12.9 03/30/2022   HCT 37.5 03/30/2022   MCV 95 03/30/2022   PLT 144 (L) 03/30/2022     GYN Per note 05-08-2022 She complained of  heavy menstrual  bleeding, passing blood clots for the past 8 months. Menstrual period last 7 -13 days, has spotting's in between. Has Intermittent cramps, intensity of the cramp varies. She has not taken any medications for her cramps. Her cramps is associated with nausea. She denies fever, chills, constipation, vomiting. MRI of the abdomen was ordered by her PCP due to abnormal pelvic US concerning for uterine mass. She stated that the MRI was not done by the radiologist because she was on her menstrual period at that the time she presented for the MRI.  Review of Systems  Constitutional:  Positive for malaise/fatigue. Negative for fever and weight loss.  HENT: Negative.  Negative for nosebleeds.   Eyes: Negative.  Negative for blurred vision, double vision and photophobia.  Respiratory: Negative.  Negative for cough and shortness of breath.   Cardiovascular: Negative.  Negative for chest pain, palpitations and leg swelling.  Gastrointestinal: Negative.  Negative for heartburn, nausea and vomiting.  Genitourinary:        AUB  Musculoskeletal: Negative.  Negative for myalgias.  Neurological: Negative.  Negative for dizziness, focal weakness, seizures and headaches.  Psychiatric/Behavioral: Negative.  Negative for suicidal ideas.      Past Medical History:  Diagnosis Date   Asthma    HLD (hyperlipidemia)    Recurrent upper respiratory infection (URI)    Shortness of breath    " with my asthma"    Past Surgical History:  Procedure Laterality Date   CESAREAN SECTION     ELBOW SURGERY Left At age 40   TONSILLECTOMY      Family History  Problem Relation Age of Onset   Colon cancer Father    Diabetes Maternal Grandmother    Diabetes Paternal Grandmother    Esophageal cancer Neg Hx    Rectal cancer Neg Hx    Stomach cancer Neg Hx     Social History Reviewed with no changes to be made today.   Outpatient Medications Prior to Visit  Medication Sig Dispense Refill   albuterol (VENTOLIN HFA) 108 (90 Base) MCG/ACT inhaler INHALE 2 PUFFS INTO THE LUNGS EVERY 6 (SIX) HOURS AS NEEDED. SHORTNESS OF BREATH 18 g 0   CALCIUM PO Take by mouth.     cetirizine (ZYRTEC) 10 MG tablet Take 1 tablet (10 mg total) by mouth daily. 30 tablet 11   ibuprofen (ADVIL) 600 MG tablet Take 1 tablet (600 mg total) by mouth every 8 (eight) hours as needed. 30 tablet 0   escitalopram (LEXAPRO) 10 MG tablet Take 1 tablet (10 mg total) by mouth daily. (Patient not taking: Reported on 09/19/2022) 90 tablet 0   gabapentin (NEURONTIN) 300 MG capsule Take 1 capsule (300 mg total) by mouth at bedtime. Prn numbness 30 capsule 5   Vitamin D, Ergocalciferol, (DRISDOL) 1.25 MG (50000 UNIT) CAPS capsule Take 1 capsule (50,000 Units total) by mouth every 7 (seven) days. 16 capsule 0   hydrocortisone (ANUSOL-HC) 25 MG suppository Place 1 suppository (25 mg total) rectally 2 (two) times daily. Prn itching 12 suppository 0   prochlorperazine (COMPAZINE) 10 MG tablet Take 1 tablet (10 mg total) by mouth every 8 (eight) hours as needed for nausea or vomiting (headache). 10 tablet 3   No facility-administered medications prior to visit.    No Known Allergies     Objective:    BP 105/72   Pulse 77   Ht 5\' 3"  (1.6 m)   Wt 168 lb 12.8 oz (76.6 kg)    LMP 09/02/2022 (Approximate)   SpO2 99%   BMI 29.90 kg/m  Wt Readings from Last 3 Encounters:  09/19/22 168 lb 12.8 oz (76.6 kg)  05/08/22 169 lb 6.4 oz (76.8 kg)  03/30/22 168 lb (76.2 kg)    Physical Exam Vitals and nursing note reviewed.  Constitutional:      Appearance: She is well-developed.  HENT:     Head: Normocephalic and atraumatic.  Cardiovascular:     Rate and Rhythm: Normal rate and regular rhythm.     Heart sounds: Normal heart sounds. No murmur  heard.    No friction rub. No gallop.  Pulmonary:     Effort: Pulmonary effort is normal. No tachypnea or respiratory distress.     Breath sounds: Normal breath sounds. No decreased breath sounds, wheezing, rhonchi or rales.  Chest:     Chest wall: No tenderness.  Abdominal:     General: Bowel sounds are normal.     Palpations: Abdomen is soft.  Musculoskeletal:        General: Normal range of motion.     Cervical back: Normal range of motion.  Skin:    General: Skin is warm and dry.  Neurological:     Mental Status: She is alert and oriented to person, place, and time.     Coordination: Coordination normal.  Psychiatric:        Behavior: Behavior normal. Behavior is cooperative.        Thought Content: Thought content normal.        Judgment: Judgment normal.          Patient has been counseled extensively about nutrition and exercise as well as the importance of adherence with medications and regular follow-up. The patient was given clear instructions to go to ER or return to medical center if symptoms don't improve, worsen or new problems develop. The patient verbalized understanding.   Follow-up: Return if symptoms worsen or fail to improve.   Gildardo Pounds, FNP-BC [facility] [place], [place]   09/19/2022, 2:15 PM

## 2022-09-26 ENCOUNTER — Telehealth: Payer: Self-pay

## 2022-09-26 LAB — CORTISOL: Cortisol: 7.9 ug/dL (ref 6.2–19.4)

## 2022-09-26 LAB — CBC WITH DIFFERENTIAL/PLATELET
Basophils Absolute: 0.1 10*3/uL (ref 0.0–0.2)
Basos: 1 %
EOS (ABSOLUTE): 0.1 10*3/uL (ref 0.0–0.4)
Eos: 1 %
Hematocrit: 37.1 % (ref 34.0–46.6)
Hemoglobin: 12.5 g/dL (ref 11.1–15.9)
Immature Grans (Abs): 0 10*3/uL (ref 0.0–0.1)
Immature Granulocytes: 0 %
Lymphocytes Absolute: 2.1 10*3/uL (ref 0.7–3.1)
Lymphs: 23 %
MCH: 30.9 pg (ref 26.6–33.0)
MCHC: 33.7 g/dL (ref 31.5–35.7)
MCV: 92 fL (ref 79–97)
Monocytes Absolute: 0.6 10*3/uL (ref 0.1–0.9)
Monocytes: 7 %
Neutrophils Absolute: 6 10*3/uL (ref 1.4–7.0)
Neutrophils: 68 %
Platelets: 289 10*3/uL (ref 150–450)
RBC: 4.04 x10E6/uL (ref 3.77–5.28)
RDW: 12.6 % (ref 11.7–15.4)
WBC: 8.8 10*3/uL (ref 3.4–10.8)

## 2022-09-26 LAB — CMP14+EGFR
ALT: 27 IU/L (ref 0–32)
AST: 20 IU/L (ref 0–40)
Albumin/Globulin Ratio: 1.9 (ref 1.2–2.2)
Albumin: 4.4 g/dL (ref 3.9–4.9)
Alkaline Phosphatase: 66 IU/L (ref 44–121)
BUN/Creatinine Ratio: 28 — ABNORMAL HIGH (ref 9–23)
BUN: 16 mg/dL (ref 6–24)
Bilirubin Total: 0.5 mg/dL (ref 0.0–1.2)
CO2: 20 mmol/L (ref 20–29)
Calcium: 9 mg/dL (ref 8.7–10.2)
Chloride: 107 mmol/L — ABNORMAL HIGH (ref 96–106)
Creatinine, Ser: 0.57 mg/dL (ref 0.57–1.00)
Globulin, Total: 2.3 g/dL (ref 1.5–4.5)
Glucose: 111 mg/dL — ABNORMAL HIGH (ref 70–99)
Potassium: 4.1 mmol/L (ref 3.5–5.2)
Sodium: 140 mmol/L (ref 134–144)
Total Protein: 6.7 g/dL (ref 6.0–8.5)
eGFR: 115 mL/min/{1.73_m2} (ref >59)

## 2022-09-26 LAB — ALDOSTERONE + RENIN ACTIVITY W/ RATIO
Aldos/Renin Ratio: 3.3 (ref 0.0–30.0)
Aldosterone: 5.5 ng/dL (ref 0.0–30.0)
Renin Activity, Plasma: 1.684 ng/mL/hr (ref 0.167–5.380)

## 2022-09-26 LAB — VITAMIN D 25 HYDROXY (VIT D DEFICIENCY, FRACTURES): Vit D, 25-Hydroxy: 15.6 ng/mL — ABNORMAL LOW (ref 30.0–100.0)

## 2022-09-26 LAB — VITAMIN B12: Vitamin B-12: >2000 pg/mL — ABNORMAL HIGH (ref 232–1245)

## 2022-09-26 NOTE — Telephone Encounter (Signed)
Copied from CRM [id number]. Topic: General - Inquiry >> Sep 26, 2022  1:17 PM De Blanch wrote: Reason for CRM: Pt is calling to f/u on recent lab results.   Please advise.

## 2022-10-13 ENCOUNTER — Other Ambulatory Visit: Payer: Self-pay | Admitting: Family Medicine

## 2022-10-13 DIAGNOSIS — E559 Vitamin D deficiency, unspecified: Secondary | ICD-10-CM

## 2022-10-13 NOTE — Telephone Encounter (Signed)
Requested medication (s) are due for refill today: yes  Requested medication (s) are on the active medication list: yes  Last refill:  01/06/21 #16/0  Future visit scheduled: no  Notes to clinic:  Unable to refill per protocol, cannot delegate.    Requested Prescriptions  Pending Prescriptions Disp Refills   Vitamin D, Ergocalciferol, (DRISDOL) 1.25 MG (50000 UNIT) CAPS capsule 16 capsule 0    Sig: Take 1 capsule (50,000 Units total) by mouth every 7 (seven) days.     Endocrinology:  Vitamins - Vitamin D Supplementation 2 Failed - 10/13/2022  5:19 PM      Failed - Manual Review: Route requests for 50,000 IU strength to the provider      Failed - Vitamin D in normal range and within 360 days    Vit D, 25-Hydroxy  Date Value Ref Range Status  09/19/2022 15.6 (L) 30.0 - 100.0 ng/mL Final    Comment:    Vitamin D deficiency has been defined by the Institute of Medicine and an Endocrine Society practice guideline as a level of serum 25-OH vitamin D less than 20 ng/mL (1,2). The Endocrine Society went on to further define vitamin D insufficiency as a level between 21 and 29 ng/mL (2). 1. IOM (Institute of Medicine). 2010. Dietary reference    intakes for calcium and D. [place]: The    [employer]. 2. Holick MF, Binkley Shillington, Bischoff-Ferrari HA, et al.    Evaluation, treatment, and prevention of vitamin D    deficiency: an Endocrine Society clinical practice    guideline. JCEM. 2011 Jul; 96(7):1911-30.          Passed - Ca in normal range and within 360 days    Calcium  Date Value Ref Range Status  09/19/2022 9.0 8.7 - 10.2 mg/dL Final         Passed - Valid encounter within last 12 months    Recent Outpatient Visits           3 weeks ago Chronic fatigue   [facility] [place], Shea Stakes, NP   6 months ago Chills   [facility], Marzella Schlein, [place]   1 year ago Abnormal uterine bleeding    [facility] Hoy Register, MD   1 year ago Dizziness   [facility], [place], [place]   2 years ago Numbness and tingling of both feet   [facility] [place], [place], [place]

## 2022-10-13 NOTE — Telephone Encounter (Signed)
Medication Refill - Medication: Vitamin D, Ergocalciferol, (DRISDOL) 1.25 MG (50000 UNIT) CAPS capsule [[id number]]    Has the patient contacted their pharmacy? Yes.    ( Preferred Pharmacy (with phone number or street name):  [facility] [address] ([address] [place] - [address]  [address] [place] ([place]) [place] [address]  Phone: [phone] Fax: [phone]  Hours: Not open 24 hours       Has the patient been seen for an appointment in the last year OR does the patient have an upcoming appointment? Yes.    Agent: Please be advised that RX refills may take up to 3 business days. We ask that you follow-up with your pharmacy.

## 2022-10-16 MED ORDER — VITAMIN D (ERGOCALCIFEROL) 1.25 MG (50000 UNIT) PO CAPS: 50000.0000 [IU] | ORAL_CAPSULE | ORAL | 0 refills | Status: DC

## 2022-12-14 ENCOUNTER — Ambulatory Visit (INDEPENDENT_AMBULATORY_CARE_PROVIDER_SITE_OTHER): Payer: Self-pay | Admitting: Obstetrics and Gynecology

## 2022-12-14 ENCOUNTER — Other Ambulatory Visit (HOSPITAL_COMMUNITY)
Admission: RE | Admit: 2022-12-14 | Discharge: 2022-12-14 | Disposition: A | Discharge status: Home / Self Care | Payer: Self-pay | Source: Ambulatory Visit | Attending: Obstetrics and Gynecology | Admitting: Obstetrics and Gynecology

## 2022-12-14 ENCOUNTER — Encounter: Payer: Self-pay | Admitting: Obstetrics and Gynecology

## 2022-12-14 VITALS — BP 102/70 | HR 69 | Wt 170.6 lb

## 2022-12-14 DIAGNOSIS — M791 Myalgia, unspecified site: Secondary | ICD-10-CM | POA: Insufficient documentation

## 2022-12-14 DIAGNOSIS — N939 Abnormal uterine and vaginal bleeding, unspecified: Secondary | ICD-10-CM | POA: Insufficient documentation

## 2022-12-14 DIAGNOSIS — Z603 Acculturation difficulty: Secondary | ICD-10-CM

## 2022-12-14 DIAGNOSIS — N946 Dysmenorrhea, unspecified: Secondary | ICD-10-CM

## 2022-12-14 DIAGNOSIS — Z758 Other problems related to medical facilities and other health care: Secondary | ICD-10-CM

## 2022-12-14 MED ORDER — LO LOESTRIN FE 1 MG-10 MCG / 10 MCG PO TABS
1.0000 | ORAL_TABLET | Freq: Every day | ORAL | 3 refills | Status: DC
Start: 2022-12-14 — End: 2022-12-19

## 2022-12-14 MED ORDER — DOXYCYCLINE HYCLATE 100 MG PO CAPS
100.0000 mg | ORAL_CAPSULE | Freq: Two times a day (BID) | ORAL | 0 refills | Status: AC
Start: 2022-12-14 — End: 2022-12-24

## 2022-12-14 MED ORDER — DOXYCYCLINE HYCLATE 100 MG PO CAPS
100.0000 mg | ORAL_CAPSULE | Freq: Two times a day (BID) | ORAL | 0 refills | Status: DC
Start: 2022-12-14 — End: 2022-12-14

## 2022-12-14 NOTE — Progress Notes (Signed)
NEW GYNECOLOGY PATIENT Patient name: Peggy Monroe MRN [id number]  Date of birth: 07-30-1980 Chief Complaint:   abnormal uterine bleeding      History:  Peggy Monroe is a 42 y.o. No obstetric history on Monroe. being seen today for abnormal uterine bleeding.   [place] and MRI completed last year demonstrating fibroids and cysts and wondering what is thenext step. Having daily bleeding and menses come when they should bu thaving some level of bleeding daily (spotting or light bleeding). Started  about 1.5 years ago. Having pain with intercourse. Occasional brown/black discharge with an odor. Wonering if she can get pregnant and risk fo pregnancy - but does not want to get pregnant any time soon.   Prior to the change in menses they were normal. Not previously having painful intercourse. 6 years ago used contraceptive.   Dyspareunia: not currently sexually active; relationshiop status is in limbo and will evntually have interocurse and thinks it will be uncomfortable  Does not think sex will be comfortable because of bleeding and the odor   Menses are very bad but varies from month to month - a month may be terrible, some not so bad, but another month may be normal May bleed so much and feels like she may pass out Always after will have huge clots and will feel them coming and it's "too much Will feel the clots passing  Bleeding is daily   When this bleeding started, 5 months after plateslts and blood coutn weere low but then it went back to mnoral w/ diet change. Didn't take medication de   Last year was last pap Reports having pap and EMB done at outside facility previously seeince the bleeding started - states it was done in [facility] but not seen in record. No prior test for vaginal infection .   CHC contraindications: has migraines but without aura, otherwise no contraindications      Gynecologic History No LMP recorded. Contraception: abstinence Last Pap: reports  normal pap last year Last Mammogram: none seen on Monroe  Last Colonoscopy: 2021  Obstetric History OB History  No obstetric history on Monroe.    Past Medical History:  Diagnosis Date   Asthma    HLD (hyperlipidemia)    Recurrent upper respiratory infection (URI)    Shortness of breath    " with my asthma"    Past Surgical History:  Procedure Laterality Date   CESAREAN SECTION     ELBOW SURGERY Left At age 36   TONSILLECTOMY      Current Outpatient Medications on Monroe Prior to Visit  Medication Sig Dispense Refill   albuterol (VENTOLIN HFA) 108 (90 Base) MCG/ACT inhaler INHALE 2 PUFFS INTO THE LUNGS EVERY 6 (SIX) HOURS AS NEEDED. SHORTNESS OF BREATH 18 g 0   CALCIUM PO Take by mouth.     cetirizine (ZYRTEC) 10 MG tablet Take 1 tablet (10 mg total) by mouth daily. 30 tablet 11   escitalopram (LEXAPRO) 10 MG tablet Take 1 tablet (10 mg total) by mouth daily. (Patient not taking: Reported on 09/19/2022) 90 tablet 0   ibuprofen (ADVIL) 600 MG tablet Take 1 tablet (600 mg total) by mouth every 8 (eight) hours as needed. 30 tablet 0   Vitamin D, Ergocalciferol, (DRISDOL) 1.25 MG (50000 UNIT) CAPS capsule Take 1 capsule (50,000 Units total) by mouth every 7 (seven) days. 12 capsule 0   No current facility-administered medications on Monroe prior to visit.    No Known  Allergies  Social History:  reports that she has never smoked. She has never used smokeless tobacco. She reports that she does not drink alcohol and does not use drugs.  Family History  Problem Relation Age of Onset   Colon cancer Father    Diabetes Maternal Grandmother    Diabetes Paternal Grandmother    Esophageal cancer Neg Hx    Rectal cancer Neg Hx    Stomach cancer Neg Hx     The following portions of the patient's history were reviewed and updated as appropriate: allergies, current medications, past family history, past medical history, past social history, past surgical history and problem list.  Review of  Systems Pertinent items noted in HPI and remainder of comprehensive ROS otherwise negative.  Physical Exam:  BP 102/70   Pulse 69   Wt 170 lb 9.6 oz (77.4 kg)   BMI 30.22 kg/m  Physical Exam Vitals and nursing note reviewed. Exam conducted with a chaperone present.  Constitutional:      Appearance: Normal appearance.  Cardiovascular:     Rate and Rhythm: Normal rate.  Pulmonary:     Effort: Pulmonary effort is normal.     Breath sounds: Normal breath sounds.  Genitourinary:    General: Normal vulva.     Vagina: Normal.     Cervix: Normal.     Comments: Very tender right levator ani and obturator internus Nontender posterior fornix   Neurological:     General: No focal deficit present.     Mental Status: She is alert and oriented to person, place, and time.  Psychiatric:        Mood and Affect: Mood normal.        Behavior: Behavior normal.        Thought Content: Thought content normal.        Judgment: Judgment normal.      Assessment and Plan:   1. Abnormal uterine bleeding Recommend EMB - reports having been done previously. Request biopsy results and if no records, plan for EMB at follow up visit. Will treat for presumptive chronic endometritis. Vaginal swab for suspected BV - LO LOESTRIN FE 1 MG-10 MCG / 10 MCG tablet; Take 1 tablet by mouth daily.  Dispense: 30 tablet; Refill: 3 - doxycycline (VIBRAMYCIN) 100 MG capsule; Take 1 capsule (100 mg total) by mouth 2 (two) times daily for 10 days.  Dispense: 20 capsule; Refill: 0 - Cervicovaginal ancillary only( [place])  2. Dysmenorrhea Will trial OCP for menstrual control.  - LO LOESTRIN FE 1 MG-10 MCG / 10 MCG tablet; Take 1 tablet by mouth daily.  Dispense: 30 tablet; Refill: 3 - Cervicovaginal ancillary only( [place])  3. Myalgia significant pelvic myalgia on exam - recommend PFPT.  - Ambulatory referral to Physical Therapy - Cervicovaginal ancillary only( [place])  4. Language barrier Spanish  interpreter used for encounter    Routine preventative health maintenance measures emphasized. Please refer to After Visit Summary for other counseling recommendations.   Follow-up: Return for GYN Follow Up.      Lorriane Shire, MD Obstetrician & Gynecologist, Faculty Practice Minimally Invasive Gynecologic Surgery Center for [employer], [facility]

## 2022-12-18 ENCOUNTER — Other Ambulatory Visit: Payer: Self-pay | Admitting: Obstetrics and Gynecology

## 2022-12-18 ENCOUNTER — Telehealth: Payer: Self-pay | Admitting: *Deleted

## 2022-12-18 DIAGNOSIS — N760 Acute vaginitis: Secondary | ICD-10-CM

## 2022-12-18 LAB — CERVICOVAGINAL ANCILLARY ONLY
Bacterial Vaginitis (gardnerella): POSITIVE — AB
Candida Glabrata: NEGATIVE
Candida Vaginitis: NEGATIVE
Chlamydia: NEGATIVE
Comment: NEGATIVE
Comment: NEGATIVE
Comment: NEGATIVE
Comment: NEGATIVE
Comment: NEGATIVE
Comment: NORMAL
Neisseria Gonorrhea: NEGATIVE
Trichomonas: NEGATIVE

## 2022-12-18 MED ORDER — METRONIDAZOLE 500 MG PO TABS
500.0000 mg | ORAL_TABLET | Freq: Two times a day (BID) | ORAL | 0 refills | Status: AC
Start: 2022-12-18 — End: 2022-12-25

## 2022-12-18 NOTE — Telephone Encounter (Addendum)
-----   Message from Lorriane Shire, MD sent at 12/18/2022  1:11 PM EDT ----- Notify that vaginal swab shows BV and prescription sent to pharmacy   1315  Called pt w/interpreter Debarah Crape. Pt was advised of test result and Rx sent to pharmacy. She stated that she received notification from her pharmacy that Rx is ready and costs $197.00 which she cannot afford. I advised that I will check with her pharmacy and call her back.   1750 Called pt back w/[employer] interpreter and she did not answer.  Message left stating that I have checked with the pharmacy and learned that the medication costing $197 was prescribed for her abnormal bleeding (Lo Loestrin).  I will check with the doctor to see if there is a less expensive alternative. The medication prescribed today for bacterial vaginosis will only cost $12. We would like her to obtain that medication and take as directed.

## 2022-12-19 ENCOUNTER — Other Ambulatory Visit: Payer: Self-pay | Admitting: Obstetrics and Gynecology

## 2022-12-19 DIAGNOSIS — N939 Abnormal uterine and vaginal bleeding, unspecified: Secondary | ICD-10-CM

## 2022-12-19 DIAGNOSIS — N946 Dysmenorrhea, unspecified: Secondary | ICD-10-CM

## 2022-12-19 MED ORDER — NORETHINDRONE 0.35 MG PO TABS
1.0000 | ORAL_TABLET | Freq: Every day | ORAL | 11 refills | Status: DC
Start: 2022-12-19 — End: 2023-11-05

## 2022-12-20 ENCOUNTER — Telehealth: Payer: Self-pay | Admitting: *Deleted

## 2022-12-20 NOTE — Telephone Encounter (Signed)
-----   Message from Lorriane Shire, MD sent at 12/19/2022 11:41 AM EDT ----- Regarding: RE: Rx too expensive Hello,  Can try micronor - I think that tends to be cheaper.  Ajewole  ----- Message ----- From: Jill Side, RN Sent: 12/18/2022   5:58 PM EDT To: Lorriane Shire, MD Subject: Rx too expensive                               This pt has no insurance.  She has informed us that Lo Loestrin will cost $197.00 (that's for generic) and she is unable to afford it. I have confirmed this with Pharmacist @ [facility]. Is there anything else you can prescribe which would cost less?   Diane

## 2022-12-20 NOTE — Telephone Encounter (Signed)
I called patient with Interpreter [name] and informed her provider has sent in a new prescription for Micronor which should be less expensive and to please let us know if there are any issues. She also asked about other prescription sent in and I clarified she also has Flagyl sent in. She voices understanding. Peggy Monroe

## 2023-01-18 ENCOUNTER — Ambulatory Visit (INDEPENDENT_AMBULATORY_CARE_PROVIDER_SITE_OTHER): Payer: Self-pay | Admitting: Obstetrics and Gynecology

## 2023-01-18 ENCOUNTER — Encounter: Payer: Self-pay | Admitting: Obstetrics and Gynecology

## 2023-01-18 ENCOUNTER — Other Ambulatory Visit (HOSPITAL_COMMUNITY)
Admission: RE | Admit: 2023-01-18 | Discharge: 2023-01-18 | Disposition: A | Discharge status: Home / Self Care | Payer: Self-pay | Source: Ambulatory Visit | Attending: Obstetrics and Gynecology | Admitting: Obstetrics and Gynecology

## 2023-01-18 VITALS — BP 109/76 | HR 85 | Wt 176.6 lb

## 2023-01-18 DIAGNOSIS — N946 Dysmenorrhea, unspecified: Secondary | ICD-10-CM | POA: Insufficient documentation

## 2023-01-18 DIAGNOSIS — N939 Abnormal uterine and vaginal bleeding, unspecified: Secondary | ICD-10-CM

## 2023-01-18 DIAGNOSIS — Z603 Acculturation difficulty: Secondary | ICD-10-CM

## 2023-01-18 LAB — POCT PREGNANCY, URINE: Preg Test, Ur: NEGATIVE

## 2023-01-18 MED ORDER — IBUPROFEN 800 MG PO TABS
800.0000 mg | ORAL_TABLET | Freq: Once | ORAL | Status: AC
Start: 2023-01-18 — End: 2023-01-18
  Administered 2023-01-18: 800 mg via ORAL

## 2023-01-18 NOTE — Progress Notes (Signed)
    GYNECOLOGY VISIT  Patient name: Peggy Monroe MRN [id number]  Date of birth: [birth year] Chief Complaint:   Follow-up and Procedure   History:  Peggy Monroe is a 42 y.o. No obstetric history on file. being seen today for EMB for AUB.  Did not previously have EMB completed.   Past Medical History:  Diagnosis Date   Asthma    HLD (hyperlipidemia)    Recurrent upper respiratory infection (URI)    Shortness of breath    " with my asthma"    Past Surgical History:  Procedure Laterality Date   CESAREAN SECTION     ELBOW SURGERY Left At age 18   TONSILLECTOMY      The following portions of the patient's history were reviewed and updated as appropriate: allergies, current medications, past family history, past medical history, past social history, past surgical history and problem list.    Review of Systems:  Pertinent items are noted in HPI. Comprehensive review of systems was otherwise negative.   Objective:  Physical Exam BP 109/76   Pulse 85   Wt 176 lb 9.6 oz (80.1 kg)   LMP 01/14/2023   BMI 31.28 kg/m    Physical Exam Vitals and nursing note reviewed. Exam conducted with a chaperone present.  Constitutional:      Appearance: Normal appearance.  HENT:     Head: Normocephalic and atraumatic.  Pulmonary:     Effort: Pulmonary effort is normal.     Breath sounds: Normal breath sounds.  Genitourinary:    General: Normal vulva.     Exam position: Lithotomy position.     Vagina: Normal.     Cervix: Normal.  Skin:    General: Skin is warm and dry.  Neurological:     General: No focal deficit present.     Mental Status: She is alert.  Psychiatric:        Mood and Affect: Mood normal.        Behavior: Behavior normal.        Thought Content: Thought content normal.        Judgment: Judgment normal.      Endometrial Biopsy Procedure  Patient identified, informed consent performed,  indication reviewed, consent signed.  Reviewed  risk of perforation, pain, bleeding, insufficient sample, etc were reviewd. Time out was performed.  Urine pregnancy test negative.  Speculum placed in the vagina.  Cervix visualized.  Cleaned with Betadine x 2.  Anterior cervix infiltrated grasped anteriorly with a single tooth tenaculum.  Paracervical block was not administered.  Endometrial pipelle was used to draw up 1cc of 1% lidocaine, introduced into the cervical os and instilled into the endometrial cavity.  The pipelle was passed thrice without difficulty and sample obtained. Tenaculum was removed, good hemostasis noted.  Patient tolerated procedure well.  Patient was given post-procedure instructions.     Assessment & Plan:   1. Abnormal uterine bleeding Now s/p uncomplicated EMB as part of AUB workup. Continue micronor for menstrual management.  - Pregnancy, urine POC - Surgical pathology - ibuprofen (ADVIL) tablet 800 mg  2. Dysmenorrhea Continue micronor for menstrual management. Has pelvic myalgia, previously recommend PFPT.  - Pregnancy, urine POC - Surgical pathology - ibuprofen (ADVIL) tablet 800 mg  3. Language barrier In person spanish interpreter used for encounter   Routine preventative health maintenance measures emphasized.  Lorriane Shire, MD Minimally Invasive Gynecologic Surgery Center for [facility], [facility], [facility]

## 2023-01-24 ENCOUNTER — Other Ambulatory Visit: Payer: Self-pay | Admitting: Obstetrics and Gynecology

## 2023-01-24 ENCOUNTER — Telehealth: Payer: Self-pay | Admitting: Lactation Services

## 2023-01-24 DIAGNOSIS — M791 Myalgia, unspecified site: Secondary | ICD-10-CM

## 2023-01-24 NOTE — Telephone Encounter (Signed)
-----   Message from Lorriane Shire sent at 01/24/2023  8:23 AM EDT ----- Notify patient of normal endometrial biopsy. Continue medication for bleeding/contraception. Also recommend pelvic physical therapy for pelvic pain, new referral has been placed and can followup in 3-4 months

## 2023-01-24 NOTE — Telephone Encounter (Signed)
Called patient with assistance of [employer], Public house manager. Informed of results and recommendations. Patient pleased with negative results.   Patient asked about continues spotting, reviewed that it may take a few months for the medication to be fully effective and can discuss with GYN when she returns.   Patient advised to call the office in 2 months to schedule an appointment in 3-4 months from now for follow up.   Patient voiced understanding to all the above. She has no further questions or concerns at this time.

## 2023-03-03 ENCOUNTER — Emergency Department (HOSPITAL_COMMUNITY)
Admission: EM | Admit: 2023-03-03 | Discharge: 2023-03-04 | Disposition: A | Discharge status: Home / Self Care | Payer: Self-pay | Attending: Emergency Medicine | Admitting: Emergency Medicine

## 2023-03-03 ENCOUNTER — Encounter (HOSPITAL_COMMUNITY): Payer: Self-pay | Admitting: Emergency Medicine

## 2023-03-03 ENCOUNTER — Other Ambulatory Visit: Payer: Self-pay

## 2023-03-03 DIAGNOSIS — S71132A Puncture wound without foreign body, left thigh, initial encounter: Secondary | ICD-10-CM | POA: Insufficient documentation

## 2023-03-03 DIAGNOSIS — S41131A Puncture wound without foreign body of right upper arm, initial encounter: Secondary | ICD-10-CM | POA: Insufficient documentation

## 2023-03-03 DIAGNOSIS — L299 Pruritus, unspecified: Secondary | ICD-10-CM | POA: Insufficient documentation

## 2023-03-03 DIAGNOSIS — W5581XA Bitten by other mammals, initial encounter: Secondary | ICD-10-CM | POA: Insufficient documentation

## 2023-03-03 DIAGNOSIS — R202 Paresthesia of skin: Secondary | ICD-10-CM | POA: Insufficient documentation

## 2023-03-03 LAB — BASIC METABOLIC PANEL WITH GFR
Anion gap: 6 (ref 5–15)
BUN: 13 mg/dL (ref 6–20)
CO2: 24 mmol/L (ref 22–32)
Calcium: 8.7 mg/dL — ABNORMAL LOW (ref 8.9–10.3)
Chloride: 107 mmol/L (ref 98–111)
Creatinine, Ser: 0.59 mg/dL (ref 0.44–1.00)
GFR, Estimated: >60 mL/min (ref >60)
Glucose, Bld: 122 mg/dL — ABNORMAL HIGH (ref 70–99)
Potassium: 3.2 mmol/L — ABNORMAL LOW (ref 3.5–5.1)
Sodium: 137 mmol/L (ref 135–145)

## 2023-03-03 LAB — CBC WITH DIFFERENTIAL/PLATELET
Abs Immature Granulocytes: 0.04 10*3/uL (ref 0.00–0.07)
Basophils Absolute: 0.1 10*3/uL (ref 0.0–0.1)
Basophils Relative: 1 %
Eosinophils Absolute: 0.1 10*3/uL (ref 0.0–0.5)
Eosinophils Relative: 1 %
HCT: 37 % (ref 36.0–46.0)
Hemoglobin: 12.3 g/dL (ref 12.0–15.0)
Immature Granulocytes: 0 %
Lymphocytes Relative: 26 %
Lymphs Abs: 2.8 10*3/uL (ref 0.7–4.0)
MCH: 31.4 pg (ref 26.0–34.0)
MCHC: 33.2 g/dL (ref 30.0–36.0)
MCV: 94.4 fL (ref 80.0–100.0)
Monocytes Absolute: 0.9 10*3/uL (ref 0.1–1.0)
Monocytes Relative: 9 %
Neutro Abs: 6.7 10*3/uL (ref 1.7–7.7)
Neutrophils Relative %: 63 %
Platelets: 267 10*3/uL (ref 150–400)
RBC: 3.92 MIL/uL (ref 3.87–5.11)
RDW: 14.2 % (ref 11.5–15.5)
WBC: 10.6 10*3/uL — ABNORMAL HIGH (ref 4.0–10.5)
nRBC: 0 % (ref 0.0–0.2)

## 2023-03-03 LAB — MAGNESIUM: Magnesium: 2.3 mg/dL (ref 1.7–2.4)

## 2023-03-03 MED ORDER — AMOXICILLIN-POT CLAVULANATE 875-125 MG PO TABS
1.0000 | ORAL_TABLET | Freq: Two times a day (BID) | ORAL | 0 refills | Status: DC
Start: 2023-03-03 — End: 2023-11-05

## 2023-03-03 MED ORDER — HYDROXYZINE HCL 25 MG PO TABS
25.0000 mg | ORAL_TABLET | Freq: Four times a day (QID) | ORAL | 0 refills | Status: DC
Start: 2023-03-03 — End: 2023-08-08

## 2023-03-03 MED ORDER — POTASSIUM CHLORIDE CRYS ER 20 MEQ PO TBCR
40.0000 meq | EXTENDED_RELEASE_TABLET | Freq: Once | ORAL | Status: AC
  Administered 2023-03-03: 40 meq via ORAL
  Filled 2023-03-03: qty 2

## 2023-03-03 NOTE — Discharge Instructions (Addendum)
Your blood work was reassuring.  Exam is reassuring.  I started on Augmentin due to the marked by.  Follow-up with your primary care provider.  For any concerning symptoms return to the emergency room.  You stated your tetanus shot was up-to-date.  You stated the monkey was up-to-date on all of the vaccinations including rabies.

## 2023-03-03 NOTE — ED Triage Notes (Addendum)
Pt reports all body tingling x 5 days. Associated with feeling flushed and warm all over. No rash.   Sts she was bitten by a monkey approx 1 week ago to right arm and left upper inner thigh. Sts bruise to inner thigh. Denies redness, swelling, or warmth to touch. Unable to visualize area in triage. Reports monkey is a friends family pet whos fully vaccinated.   Pt requires spanish speaking interpreter.

## 2023-03-03 NOTE — ED Provider Notes (Signed)
[place] EMERGENCY DEPARTMENT AT [facility] Provider Note   CSN: [id number] Arrival date & time: 03/03/23  2106     History  Chief Complaint  Patient presents with   Tingling   Animal Bite    Odali Ismail Corey Harold is a 42 y.o. female.  42 year old female presents today for general flushed sensation as well as itchiness that has been going on for about 1 week.  She also reports being bit by a monkey but this occurred after the other symptoms started.  This was a family friend's monkey.  Up-to-date on all vaccinations including rabies.  Patient states that her tetanus shot was updated a few years ago.  Confirmed in the chart.  Otherwise no complaints.  She was bit in the right upper arm, and left inner thigh.  The history is provided by the patient. No language interpreter was used.       Home Medications Prior to Admission medications   Medication Sig Start Date End Date Taking? Authorizing Provider  albuterol (VENTOLIN HFA) 108 (90 Base) MCG/ACT inhaler INHALE 2 PUFFS INTO THE LUNGS EVERY 6 (SIX) HOURS AS NEEDED. SHORTNESS OF BREATH 07/21/20 09/19/22  Hoy Register, MD  CALCIUM PO Take by mouth. Patient not taking: Reported on 12/14/2022    [provider]  cetirizine (ZYRTEC) 10 MG tablet Take 1 tablet (10 mg total) by mouth daily. Patient not taking: Reported on 12/14/2022 03/30/22   Anders Simmonds, PA-C  escitalopram (LEXAPRO) 10 MG tablet Take 1 tablet (10 mg total) by mouth daily. Patient not taking: Reported on 09/19/2022 05/08/22   Donell Beers, FNP  ibuprofen (ADVIL) 600 MG tablet Take 1 tablet (600 mg total) by mouth every 8 (eight) hours as needed. Patient not taking: Reported on 12/14/2022 05/16/22   Donell Beers, FNP  norethindrone (MICRONOR) 0.35 MG tablet Take 1 tablet (0.35 mg total) by mouth daily. Patient not taking: Reported on 01/18/2023 12/19/22   Lorriane Shire, MD  Vitamin D, Ergocalciferol, (DRISDOL) 1.25 MG (50000  UNIT) CAPS capsule Take 1 capsule (50,000 Units total) by mouth every 7 (seven) days. Patient not taking: Reported on 01/18/2023 10/16/22   Hoy Register, MD      Allergies    Patient has no known allergies.    Review of Systems   Review of Systems  Constitutional:  Negative for chills and fever.  Musculoskeletal:  Negative for arthralgias.  Skin:  Negative for rash.  Neurological:  Negative for light-headedness.  All other systems reviewed and are negative.   Physical Exam Updated Vital Signs BP 120/75 (BP Location: Left Arm)   Pulse 80   Temp 98.2 F (36.8 C) (Oral)   Resp 19   Ht 5\' 6"  (1.676 m)   Wt 77.1 kg   LMP 02/09/2023   SpO2 98%   BMI 27.44 kg/m  Physical Exam Vitals and nursing note reviewed.  Constitutional:      General: She is not in acute distress.    Appearance: Normal appearance. She is not ill-appearing.  HENT:     Head: Normocephalic and atraumatic.     Nose: Nose normal.  Eyes:     Conjunctiva/sclera: Conjunctivae normal.  Pulmonary:     Effort: Pulmonary effort is normal. No respiratory distress.  Musculoskeletal:        General: No deformity.  Skin:    Findings: No rash.     Comments: Superficial puncture wounds noted to right upper arm, left inner thigh.  No surrounding  erythema, bleeding, or other concerning findings.  Neurological:     Mental Status: She is alert.     ED Results / Procedures / Treatments   Labs (all labs ordered are listed, but only abnormal results are displayed) Labs Reviewed  CBC WITH DIFFERENTIAL/PLATELET  BASIC METABOLIC PANEL  MAGNESIUM    EKG None  Radiology No results found.  Procedures Procedures    Medications Ordered in ED Medications - No data to display  ED Course/ Medical Decision Making/ A&P                                 Medical Decision Making Amount and/or Complexity of Data Reviewed Labs: ordered.  Risk Prescription drug management.   42 year old female presents today for  concern of generalized flushing sensation, and itching.  Was bit by monkey after the symptoms started.  Puncture wounds appear superficial.  She is up-to-date on Tdap.  Monkey had all of the vaccinations including rabies.  Will obtain basic blood work.  If this is without concerns she is appropriate for discharge follow-up with PCP and will discharge with Augmentin.   Final Clinical Impression(s) / ED Diagnoses Final diagnoses:  Wound due to monkey bite  Pruritus    Rx / DC Orders ED Discharge Orders     None         Marita Kansas, PA-C 03/03/23 2342    Terald Sleeper, MD 03/04/23 1459

## 2023-06-18 ENCOUNTER — Ambulatory Visit: Payer: Self-pay | Admitting: *Deleted

## 2023-06-18 NOTE — Telephone Encounter (Signed)
Interpreter Jhovani ID # [id number] Chief Complaint: chest pain radiating neck and jaw Symptoms: chest pain radiates to left neck and jaw. Side of heart pain. No dizziness now but reports has had dizziness. Chest pain constant but intensity comes and goes lasting approx 5 minutes. Headache back ache and feet hurt Frequency: last week  Pertinent Negatives: Patient denies difficulty breathing  Disposition: [x] ED /[] Urgent Care (no appt availability in office) / [] Appointment(In office/virtual)/ []  [place] [facility]/ [] Home Care/ [] Refused Recommended Disposition /[] [place] [facility]/ []  Follow-up with PCP Additional Notes:   Recommended ED. Unsure patient will go . Wants appt with PCP. Please advise.         Reason for Disposition  [1] Chest pain (or "angina") comes and goes AND [2] is happening more often (increasing in frequency) or getting worse (increasing in severity)  (Exception: Chest pains that last only a few seconds.)  Answer Assessment - Initial Assessment Questions 1. LOCATION: "Where does it hurt?"       Middle of chest  2. RADIATION: "Does the pain go anywhere else?" (e.g., into neck, jaw, arms, back)     To neck and jaw left side  3. ONSET: "When did the chest pain begin?" (Minutes, hours or days)      Last week  4. PATTERN: "Does the pain come and go, or has it been constant since it started?"  "Does it get worse with exertion?"      Dizziness comes and goes 5. DURATION: "How long does it last" (e.g., seconds, minutes, hours)    Approx 5 minutes  6. SEVERITY: "How bad is the pain?"  (e.g., Scale 1-10; mild, moderate, or severe)    - MILD (1-3): doesn't interfere with normal activities     - MODERATE (4-7): interferes with normal activities or awakens from sleep    - SEVERE (8-10): excruciating pain, unable to do any normal activities       More intense at times.  7. CARDIAC RISK FACTORS: "Do you have any history of heart problems or risk factors for heart  disease?" (e.g., angina, prior heart attack; diabetes, high blood pressure, high cholesterol, smoker, or strong family history of heart disease)     Na  8. PULMONARY RISK FACTORS: "Do you have any history of lung disease?"  (e.g., blood clots in lung, asthma, emphysema, birth control pills)    Asthma  9. CAUSE: "What do you think is causing the chest pain?"     nOt sure  10. OTHER SYMPTOMS: "Do you have any other symptoms?" (e.g., dizziness, nausea, vomiting, sweating, fever, difficulty breathing, cough)       Chest pain  middle chest  neck and jaw left side  11. PREGNANCY: "Is there any chance you are pregnant?" "When was your last menstrual period?"       na  Protocols used: Chest Pain-A-AH

## 2023-06-19 NOTE — Telephone Encounter (Signed)
 Noted.

## 2023-06-25 ENCOUNTER — Emergency Department (HOSPITAL_COMMUNITY)
Admission: EM | Admit: 2023-06-25 | Discharge: 2023-06-26 | Discharge status: Against Medical Advice | Payer: Self-pay | Attending: Emergency Medicine | Admitting: Emergency Medicine

## 2023-06-25 ENCOUNTER — Emergency Department (HOSPITAL_COMMUNITY): Payer: Self-pay

## 2023-06-25 DIAGNOSIS — Z5321 Procedure and treatment not carried out due to patient leaving prior to being seen by health care provider: Secondary | ICD-10-CM | POA: Insufficient documentation

## 2023-06-25 DIAGNOSIS — M791 Myalgia, unspecified site: Secondary | ICD-10-CM | POA: Insufficient documentation

## 2023-06-25 DIAGNOSIS — Z20822 Contact with and (suspected) exposure to covid-19: Secondary | ICD-10-CM | POA: Insufficient documentation

## 2023-06-25 DIAGNOSIS — R079 Chest pain, unspecified: Secondary | ICD-10-CM | POA: Insufficient documentation

## 2023-06-25 LAB — HCG, SERUM, QUALITATIVE: Preg, Serum: NEGATIVE

## 2023-06-25 LAB — CBC
HCT: 37.9 % (ref 36.0–46.0)
Hemoglobin: 12.6 g/dL (ref 12.0–15.0)
MCH: 31.6 pg (ref 26.0–34.0)
MCHC: 33.2 g/dL (ref 30.0–36.0)
MCV: 95 fL (ref 80.0–100.0)
Platelets: 304 10*3/uL (ref 150–400)
RBC: 3.99 MIL/uL (ref 3.87–5.11)
RDW: 12.9 % (ref 11.5–15.5)
WBC: 8.7 10*3/uL (ref 4.0–10.5)
nRBC: 0 % (ref 0.0–0.2)

## 2023-06-25 LAB — BASIC METABOLIC PANEL
Anion gap: 8 (ref 5–15)
BUN: 17 mg/dL (ref 6–20)
CO2: 24 mmol/L (ref 22–32)
Calcium: 8.8 mg/dL — ABNORMAL LOW (ref 8.9–10.3)
Chloride: 102 mmol/L (ref 98–111)
Creatinine, Ser: 0.51 mg/dL (ref 0.44–1.00)
GFR, Estimated: >60 mL/min (ref >60)
Glucose, Bld: 109 mg/dL — ABNORMAL HIGH (ref 70–99)
Potassium: 3.7 mmol/L (ref 3.5–5.1)
Sodium: 134 mmol/L — ABNORMAL LOW (ref 135–145)

## 2023-06-25 LAB — RESP PANEL BY RT-PCR (RSV, FLU A&B, COVID)  RVPGX2
Influenza A by PCR: NEGATIVE
Influenza B by PCR: NEGATIVE
Resp Syncytial Virus by PCR: NEGATIVE
SARS Coronavirus 2 by RT PCR: NEGATIVE

## 2023-06-25 LAB — TROPONIN I (HIGH SENSITIVITY): Troponin I (High Sensitivity): <2 ng/L (ref <18)

## 2023-06-25 NOTE — ED Triage Notes (Signed)
 Pt c/o chest pain x today.  Pt c/o cold and flu s/s x 1 week, family at home sick, pt with generalized pain/body aches. Took aleve this AM with no relief

## 2023-06-26 LAB — TROPONIN I (HIGH SENSITIVITY): Troponin I (High Sensitivity): <2 ng/L (ref <18)

## 2023-07-13 ENCOUNTER — Other Ambulatory Visit: Payer: Self-pay

## 2023-07-13 ENCOUNTER — Telehealth: Payer: Self-pay | Admitting: Family Medicine

## 2023-07-13 ENCOUNTER — Other Ambulatory Visit: Payer: Self-pay | Admitting: Family Medicine

## 2023-07-13 MED ORDER — ALBUTEROL SULFATE HFA 108 (90 BASE) MCG/ACT IN AERS
2.0000 | INHALATION_SPRAY | Freq: Four times a day (QID) | RESPIRATORY_TRACT | 0 refills | Status: AC | PRN
  Filled 2023-07-13: qty 6.7, 25d supply, fill #0

## 2023-07-13 NOTE — Telephone Encounter (Signed)
Copied from CRM [id number]. Topic: Clinical - Medication Refill >> Jul 13, 2023 11:28 AM Ward Chatters wrote: Most Recent Primary Care Visit:  Provider: Bertram Denver W  Department: [facility]  Visit Type: OFFICE VISIT  Date: 09/19/2022  Medication: albuterol (VENTOLIN HFA) 108 (90 Base) MCG/ACT inhaler  Has the patient contacted their pharmacy? No  Is this the correct pharmacy for this prescription? Yes   This is the patient's preferred pharmacy:  [facility]  Phone: [phone] Fax: [phone]  Has the prescription been filled recently? No  Is the patient out of the medication? Yes  Has the patient been seen for an appointment in the last year OR does the patient have an upcoming appointment? Yes  Can we respond through MyChart? Yes  Agent: Please be advised that Rx refills may take up to 3 business days. We ask that you follow-up with your pharmacy.

## 2023-07-13 NOTE — Telephone Encounter (Signed)
Copied from CRM [id number]. Topic: Clinical - Lab/Test Results >> Jul 13, 2023 11:22 AM Patsy Lager T wrote: Reason for CRM: patient went to ER but was sent home after getting labs done as there were no beds. Patient has her results but want to know if she need to be on medication as the ER doctor did not prescribe anything. Please f/u with patient

## 2023-07-13 NOTE — Telephone Encounter (Signed)
Call to patient  to schedule a VV at 8:10 with one of our providers unable to reach message left VIA interpreter [phone]

## 2023-07-19 ENCOUNTER — Other Ambulatory Visit: Payer: Self-pay

## 2023-08-08 ENCOUNTER — Telehealth (HOSPITAL_BASED_OUTPATIENT_CLINIC_OR_DEPARTMENT_OTHER): Payer: Self-pay | Admitting: Physician Assistant

## 2023-08-08 DIAGNOSIS — F341 Dysthymic disorder: Secondary | ICD-10-CM

## 2023-08-08 DIAGNOSIS — Z758 Other problems related to medical facilities and other health care: Secondary | ICD-10-CM

## 2023-08-08 DIAGNOSIS — Z603 Acculturation difficulty: Secondary | ICD-10-CM

## 2023-08-08 DIAGNOSIS — R0789 Other chest pain: Secondary | ICD-10-CM

## 2023-08-08 MED ORDER — HYDROXYZINE HCL 25 MG PO TABS
25.0000 mg | ORAL_TABLET | Freq: Four times a day (QID) | ORAL | 1 refills | Status: DC
Start: 2023-08-08 — End: 2023-11-05

## 2023-08-08 MED ORDER — OMEPRAZOLE 20 MG PO CPDR
20.0000 mg | DELAYED_RELEASE_CAPSULE | Freq: Every day | ORAL | 3 refills | Status: DC
Start: 2023-08-08 — End: 2024-03-05

## 2023-08-08 NOTE — Progress Notes (Signed)
Virtual Visit via Video Note  I connected with Peggy Monroe on 08/08/23 at 10:30 AM EST by a video enabled telemedicine application and verified that I am speaking with the correct person using two identifiers.  Location: Patient: in her car parked Provider: [facility] office   I discussed the limitations of evaluation and management by telemedicine and the availability of in person appointments. The patient expressed understanding and agreed to proceed.  History of Present Illness:  patient was seen in ED for CP in January 2025.  Cardiac enzymes were normal.  She had mild hyponatremia and glucose of 109.   She is NOT having CP currently.  She is wanting to go over the labs from that day and to know if there is anything she can take for when she has the pain.  The pain is intermittent.  She does note the pain occurs more often in times of stress.  The pain is sharp in her L chest and L arm.  She may also feel her breathing becomes labored.  The episodes can last from a few mins to 3 hours.  Sometimes causes crying.  No FH early cardiac events.  No htn, DM.  Cholesterol with elevation in 2021 and low vitamin D. Non smoker and has never smoked. Sometimes occurs after eating large meal or spicy foods.       Observations/Objective: NAD.  Breathing is non-labored.  Good pallor EKG from 06/25/2023 with NSR and no ST segment changes.  Troponins negative  Assessment and Plan: 1. Other chest pain (Primary) Explained that in January, the tests were reassuring as far as a heart condition.  I believe the pain is non cardiac(elevated cholesterol is only identifiable risk factor.  I have instructed her to go to the ED again if pain worsens or is concerning again.  She verbalized understating.  For now, try treating as anxiety or acid reflux - omeprazole (PRILOSEC) 20 MG capsule; Take 1 capsule (20 mg total) by mouth daily.  Dispense: 30 capsule; Refill: 3 - hydrOXYzine (ATARAX) 25 MG tablet; Take 1  tablet (25 mg total) by mouth every 6 (six) hours. PRN anxiety  Dispense: 40 tablet; Refill: 1  2. ANXIETY DEPRESSION Will try - hydrOXYzine (ATARAX) 25 MG tablet; Take 1 tablet (25 mg total) by mouth every 6 (six) hours. PRN anxiety  Dispense: 40 tablet; Refill: 1  3. Language barrier Pacific interpreters used and additional time performing visit was required. [id number]    Follow Up Instructions: PCP ([place] follow up in 6 to 8 weeks please)   I discussed the assessment and treatment plan with the patient. The patient was provided an opportunity to ask questions and all were answered. The patient agreed with the plan and demonstrated an understanding of the instructions.   The patient was advised to call back or seek an in-person evaluation if the symptoms worsen or if the condition fails to improve as anticipated.  I provided 14 minutes of non-face-to-face time during this encounter.   Georgian Co, PA-C  Patient ID: Peggy Monroe, female   DOB: 1981/05/24, 43 y.o.   MRN: [id number]

## 2023-10-19 ENCOUNTER — Telehealth: Payer: Self-pay | Admitting: Family Medicine

## 2023-10-19 NOTE — Telephone Encounter (Signed)
 Copied from CRM [id number]. Topic: Clinical - Medication Question  >> Oct 19, 2023  1:30 PM Sophia H wrote: Reason for CRM: Pt states she was prescribed a medication for pain at her last visit. Unsure of the name of the medication, wants it to be sent to the [facility] pharmacy on file. ty

## 2023-10-19 NOTE — Telephone Encounter (Signed)
 Patient has appointment scheduled to see a provider on 11/05/2023

## 2023-11-05 ENCOUNTER — Encounter (INDEPENDENT_AMBULATORY_CARE_PROVIDER_SITE_OTHER): Payer: Self-pay | Admitting: Primary Care

## 2023-11-05 ENCOUNTER — Other Ambulatory Visit: Payer: Self-pay

## 2023-11-05 ENCOUNTER — Ambulatory Visit (INDEPENDENT_AMBULATORY_CARE_PROVIDER_SITE_OTHER): Payer: Self-pay | Admitting: Primary Care

## 2023-11-05 VITALS — BP 105/70 | HR 76 | Resp 15 | Wt 174.0 lb

## 2023-11-05 DIAGNOSIS — R0981 Nasal congestion: Secondary | ICD-10-CM

## 2023-11-05 MED ORDER — FLUTICASONE PROPIONATE 50 MCG/ACT NA SUSP
2.0000 | Freq: Every day | NASAL | 6 refills | Status: DC
Start: 2023-11-05 — End: 2024-03-05
  Filled 2023-11-05: qty 16, 30d supply, fill #0

## 2023-11-05 NOTE — Patient Instructions (Signed)
Mareos Dizziness Los mareos son un problema muy frecuente. Causan sensacin de inestabilidad o de desvanecimiento. Puede sentir que se va a desmayar. Los [employer] pueden provocarle una lesin si se tropieza o se cae. Es ms frecuente sentirse mareado si es un adulto mayor. Hay muchas cosas que pueden hacer que se sienta mareado. Esto incluye lo siguiente: Medicamentos. Deshidratacin. Esto ocurre cuando no hay suficiente agua en el cuerpo. Enfermedad. Siga estas instrucciones en su casa: Comida y bebida  Beber suficiente lquido para mantener el pis (orina) de color amarillo plido. Esto evita la deshidratacin. Trate de beber ms lquidos transparentes, como agua. No beba alcohol. Trate de limitar la cantidad de cafena que consume. Trate de limitar la cantidad de sal, tambin llamada sodio, que consume. Actividad Trate de no hacer movimientos rpidos. Prese lentamente despus de estar sentado en una silla. Sostngase hasta que se sienta bien. Por la maana, sintese primero a un lado de la cama. Cuando se sienta bien, sostngase de algo y pngase lentamente de pie. Haga esto hasta que se sienta seguro en cuanto al equilibrio. Mueva las piernas con frecuencia si debe estar de pie en un lugar durante mucho tiempo. Mientras est de pie, contraiga y relaje los msculos de las piernas. No conduzca ni use mquinas si se siente mareado. Evite agacharse si se siente mareado. En su casa, coloque los objetos en algn lugar que pueda alcanzarlos sin agacharse. Estilo de vida No fume ni use vapeadores o productos que tengan nicotina o tabaco. Si necesita ayuda para dejar de fumar, hable con el mdico. Intente bajar el nivel de estrs. Puede hacerlo usando mtodos como el yoga o la meditacin. Hable con el mdico si necesita ayuda. Instrucciones generales Controle sus mareos para ver si hay cambios. Use los medicamentos solamente como se lo haya indicado el mdico. Hable con el mdico si cree que la  causa de sus mareos es algn medicamento que est tomando. Dgale a un amigo o a un familiar si se siente mareado. Si detectan algn cambio en su comportamiento, pdales que llamen a su mdico. Comunquese con un mdico si: Los mareos no desaparecen o tiene sntomas nuevos. Su mareo empeora. Siente que va a vomitar. Tiene problemas para escuchar. Tiene fiebre. Tiene dolor o rigidez en el cuello. Se cae o se lastima. Solicite ayuda de inmediato si: Vomita cada vez que come o bebe. Tiene heces acuosas y no puede comer ni beber. Tiene problemas para hablar, caminar, tragar o [employer], las manos o las piernas. Se siente muy dbil. Est sangrando. No piensa con claridad o tiene problemas para armar oraciones. Un amigo o un familiar pueden notar que esto ocurre. Le cambia la visin o tiene un dolor de cabeza muy intenso. Estos sntomas pueden Customer service manager. Llame al 911 de inmediato. No espere a ver si los sntomas desaparecen. No conduzca por sus propios medios [employer]. Esta informacin no tiene Theme park manager el consejo del mdico. Asegrese de hacerle al mdico cualquier pregunta que tenga. Document Revised: 09/14/2022 Document Reviewed: 09/14/2022 Elsevier Patient Education  2024 [employer].

## 2023-11-05 NOTE — Progress Notes (Signed)
 [facility]  Peggy Monroe, is a 43 y.o. female  ZOX:[id number]  WJX:[id number]  DOB - 1980-10-24  Chief Complaint  Patient presents with   Dizziness    Pt states weakness at both work and home sometimes.   Nail Problem    Has had black toenails starting 5 months ago       Subjective:   Peggy Monroe is a 43 y.o. Hispanic female (interpreter Benancio Bracket [phone]) here today for an acute visit. Feels something is wrong with her body because she doesn't have headaches or dizziness.    HPI Dizziness This is a new problem. The current episode started 1 to 4 weeks ago. The problem occurs rarely. The problem has been gradually worsening. Nothing aggravates the symptoms. The treatment provided no relief.      No problems updated.  Comprehensive ROS Pertinent positive and negative noted in HPI   No Known Allergies  Past Medical History:  Diagnosis Date   Asthma    HLD (hyperlipidemia)    Recurrent upper respiratory infection (URI)    Shortness of breath    " with my asthma"    Current Outpatient Medications on File Prior to Visit  Medication Sig Dispense Refill   albuterol  (VENTOLIN  HFA) 108 (90 Base) MCG/ACT inhaler INHALE 2 PUFFS INTO THE LUNGS EVERY 6 (SIX) HOURS AS NEEDED. SHORTNESS OF BREATH 6.7 g 0   omeprazole  (PRILOSEC) 20 MG capsule Take 1 capsule (20 mg total) by mouth daily. 30 capsule 3   cetirizine  (ZYRTEC ) 10 MG tablet Take 1 tablet (10 mg total) by mouth daily. (Patient not taking: Reported on 11/05/2023) 30 tablet 11   Vitamin D , Ergocalciferol , (DRISDOL ) 1.25 MG (50000 UNIT) CAPS capsule Take 1 capsule (50,000 Units total) by mouth every 7 (seven) days. (Patient not taking: Reported on 11/05/2023) 12 capsule 0   No current facility-administered medications on file prior to visit.   Health Maintenance  Topic Date Due   Pap with HPV screening  Never done   COVID-19 Vaccine (1 - 2024-25 season) Never done   Flu Shot   01/18/2024   Colon Cancer Screening  10/01/2024   DTaP/Tdap/Td vaccine (2 - Td or Tdap) 07/22/2029   Hepatitis C Screening  Completed   HIV Screening  Completed   HPV Vaccine  Aged Out   Meningitis B Vaccine  Aged Out    Objective:   Vitals:   11/05/23 1610  BP: 105/70  Pulse: 76  Resp: 15  SpO2: 98%  Weight: 174 lb (78.9 kg)   BP Readings from Last 3 Encounters:  11/05/23 105/70  06/26/23 122/78  03/03/23 120/75      Physical Exam Vitals reviewed.  Constitutional:      Appearance: Normal appearance.  HENT:     Head: Normocephalic.     Right Ear: Tympanic membrane, ear canal and external ear normal.     Left Ear: Tympanic membrane, ear canal and external ear normal.     Nose: Congestion present.     Comments: Turbinates red boggy swollen right greater than left    Mouth/Throat:     Mouth: Mucous membranes are moist.  Eyes:     Extraocular Movements: Extraocular movements intact.     Pupils: Pupils are equal, round, and reactive to light.  Cardiovascular:     Rate and Rhythm: Normal rate.  Pulmonary:     Effort: Pulmonary effort is normal.     Breath sounds: Normal breath sounds.  Abdominal:  General: Bowel sounds are normal.     Palpations: Abdomen is soft.  Musculoskeletal:        General: Normal range of motion.     Cervical back: Normal range of motion.  Skin:    General: Skin is warm and dry.  Neurological:     Mental Status: She is alert and oriented to person, place, and time.  Psychiatric:        Mood and Affect: Mood normal.        Behavior: Behavior normal.        Thought Content: Thought content normal.     Assessment & Plan  Peggy Monroe was seen today for dizziness and nail problem.  Diagnoses and all orders for this visit:  Nasal congestion -     fluticasone  (FLONASE ) 50 MCG/ACT nasal spray; Place 2 sprays into both nostrils daily.  Unable to examine toes/feet due to [place] manicured  Patient have been counseled extensively about  nutrition and exercise. Other issues discussed during this visit include: low cholesterol diet, weight control and daily exercise, foot care, annual eye examinations at Ophthalmology, importance of adherence with medications and regular follow-up. We also discussed long term complications of uncontrolled diabetes and hypertension.   Return for PCP if no improvement.  The patient was given clear instructions to go to ER or return to medical center if symptoms don't improve, worsen or new problems develop. The patient verbalized understanding. The patient was told to call to get lab results if they haven't heard anything in the next week.   This note has been created with Education officer, environmental. Any transcriptional errors are unintentional.   Marius Siemens, NP 11/09/2023, 12:26 PM

## 2023-11-06 ENCOUNTER — Other Ambulatory Visit: Payer: Self-pay

## 2023-11-19 ENCOUNTER — Other Ambulatory Visit: Payer: Self-pay

## 2024-03-05 ENCOUNTER — Ambulatory Visit: Payer: Self-pay | Attending: Nurse Practitioner | Admitting: Nurse Practitioner

## 2024-03-05 ENCOUNTER — Encounter: Payer: Self-pay | Admitting: Nurse Practitioner

## 2024-03-05 VITALS — BP 113/76 | HR 63 | Resp 18 | Ht 63.0 in | Wt 174.6 lb

## 2024-03-05 DIAGNOSIS — R7309 Other abnormal glucose: Secondary | ICD-10-CM

## 2024-03-05 DIAGNOSIS — R1084 Generalized abdominal pain: Secondary | ICD-10-CM

## 2024-03-05 DIAGNOSIS — G8929 Other chronic pain: Secondary | ICD-10-CM

## 2024-03-05 MED ORDER — OMEPRAZOLE 20 MG PO CPDR: 20.0000 mg | DELAYED_RELEASE_CAPSULE | Freq: Every day | ORAL | 3 refills | Status: DC

## 2024-03-05 NOTE — Progress Notes (Signed)
 Assessment & Plan:  Peggy Monroe was seen today for gastroesophageal reflux.  Diagnoses and all orders for this visit:  Chronic generalized abdominal pain -     H. pylori breath test -     omeprazole  (PRILOSEC) 20 MG capsule; Take 1 capsule (20 mg total) by mouth daily before breakfast. Gastroesophageal reflux disease with epigastric burning Symptoms consistent with acid reflux, exacerbated by certain foods. Previous omeprazole  use ineffective. - Restart omeprazole  daily in the morning for 30-90 days based on symptom persistence. - Order test for H. pylori infection. - Advised dietary modifications to reduce carbohydrates and increase protein.  Elevated glucose -     Hemoglobin A1c Rule out diabetes mellitus Elevated glucose in February. No A1c test on record. Symptoms may relate to diet. - Order A1c test to evaluate for diabetes or prediabetes.  Patient has been counseled on age-appropriate routine health concerns for screening and prevention. These are reviewed and up-to-date. Referrals have been placed accordingly. Immunizations are up-to-date or declined.    Subjective:   Chief Complaint  Patient presents with   Gastroesophageal Reflux   History of Present Illness Peggy Monroe Alysson Geist is a 43 year old female who presents with stomach burning and a sensation of emptiness after meals.  She experiences a burning sensation in her stomach and a feeling of emptiness shortly after eating, despite having just consumed a meal. This sensation is described as a 'void' and is accompanied by a persistent feeling of hunger. She avoids spicy, greasy, and heavy foods, as well as sodas, as these exacerbate the burning sensation. She previously took omeprazole , which was prescribed in February. She recalls taking it for about a month,  however, she stopped taking it after a month as she did not think her previous symptoms of chest pain warranted taking a PPI.  She has since tried  over-the-counter omeprazole , but reports it is not effective in alleviating her current symptoms.  Her last lab work in February showed slightly elevated glucose levels, but she has not had an A1c test to determine if she is prediabetic or diabetic.   Review of Systems  Constitutional:  Negative for fever, malaise/fatigue and weight loss.  HENT: Negative.  Negative for nosebleeds.   Eyes: Negative.  Negative for blurred vision, double vision and photophobia.  Respiratory: Negative.  Negative for cough and shortness of breath.   Cardiovascular: Negative.  Negative for chest pain, palpitations and leg swelling.  Gastrointestinal:  Positive for heartburn. Negative for abdominal pain, blood in stool, constipation, diarrhea, melena, nausea and vomiting.  Musculoskeletal: Negative.  Negative for myalgias.  Neurological: Negative.  Negative for dizziness, focal weakness, seizures and headaches.  Psychiatric/Behavioral: Negative.  Negative for suicidal ideas.     Past Medical History:  Diagnosis Date   Asthma    HLD (hyperlipidemia)    Recurrent upper respiratory infection (URI)    Shortness of breath     with my asthma    Past Surgical History:  Procedure Laterality Date   CESAREAN SECTION     ELBOW SURGERY Left At age 19   TONSILLECTOMY      Family History  Problem Relation Age of Onset   Colon cancer Father    Diabetes Maternal Grandmother    Diabetes Paternal Grandmother    Esophageal cancer Neg Hx    Rectal cancer Neg Hx    Stomach cancer Neg Hx     Social History Reviewed with no changes to be made today.   Outpatient Medications  Prior to Visit  Medication Sig Dispense Refill   albuterol  (VENTOLIN  HFA) 108 (90 Base) MCG/ACT inhaler INHALE 2 PUFFS INTO THE LUNGS EVERY 6 (SIX) HOURS AS NEEDED. SHORTNESS OF BREATH 6.7 g 0   fluticasone  (FLONASE ) 50 MCG/ACT nasal spray Place 2 sprays into both nostrils daily. 16 g 6   cetirizine  (ZYRTEC ) 10 MG tablet Take 1 tablet (10 mg  total) by mouth daily. (Patient not taking: Reported on 03/05/2024) 30 tablet 11   Vitamin D , Ergocalciferol , (DRISDOL ) 1.25 MG (50000 UNIT) CAPS capsule Take 1 capsule (50,000 Units total) by mouth every 7 (seven) days. (Patient not taking: Reported on 03/05/2024) 12 capsule 0   omeprazole  (PRILOSEC) 20 MG capsule Take 1 capsule (20 mg total) by mouth daily. (Patient not taking: Reported on 03/05/2024) 30 capsule 3   No facility-administered medications prior to visit.    No Known Allergies     Objective:    BP 113/76 (BP Location: Left Arm, Patient Position: Sitting, Cuff Size: Normal)   Pulse 63   Resp 18   Ht 5' 3 (1.6 m)   Wt 174 lb 9.6 oz (79.2 kg)   LMP 02/04/2024 (Exact Date)   SpO2 100%   BMI 30.93 kg/m  Wt Readings from Last 3 Encounters:  03/05/24 174 lb 9.6 oz (79.2 kg)  11/05/23 174 lb (78.9 kg)  06/25/23 172 lb (78 kg)    Physical Exam Vitals and nursing note reviewed.  Constitutional:      Appearance: She is well-developed.  HENT:     Head: Normocephalic and atraumatic.  Cardiovascular:     Rate and Rhythm: Normal rate and regular rhythm.     Heart sounds: Normal heart sounds. No murmur heard.    No friction rub. No gallop.  Pulmonary:     Effort: Pulmonary effort is normal. No tachypnea or respiratory distress.     Breath sounds: Normal breath sounds. No decreased breath sounds, wheezing, rhonchi or rales.  Chest:     Chest wall: No tenderness.  Abdominal:     General: Bowel sounds are normal.     Palpations: Abdomen is soft.  Musculoskeletal:        General: Normal range of motion.     Cervical back: Normal range of motion.  Skin:    General: Skin is warm and dry.  Neurological:     Mental Status: She is alert and oriented to person, place, and time.     Coordination: Coordination normal.  Psychiatric:        Behavior: Behavior normal. Behavior is cooperative.        Thought Content: Thought content normal.        Judgment: Judgment normal.      Discussed the use of AI scribe software for clinical note transcription with the patient, who gave verbal consent to proceed.        Patient has been counseled extensively about nutrition and exercise as well as the importance of adherence with medications and regular follow-up. The patient was given clear instructions to go to ER or return to medical center if symptoms don't improve, worsen or new problems develop. The patient verbalized understanding.   Follow-up: Return if symptoms worsen or fail to improve.   Haze LELON Servant, FNP-BC [facility] [place], [place] [phone]   03/05/2024, 3:33 PM

## 2024-03-07 LAB — HEMOGLOBIN A1C
Est. average glucose Bld gHb Est-mCnc: 117 mg/dL
Hgb A1c MFr Bld: 5.7 % — ABNORMAL HIGH (ref 4.8–5.6)

## 2024-03-07 LAB — H. PYLORI BREATH TEST

## 2024-03-07 LAB — H. PYLORI BREATH COLLECTION

## 2024-03-10 ENCOUNTER — Ambulatory Visit: Payer: Self-pay | Admitting: Nurse Practitioner

## 2024-06-16 ENCOUNTER — Telehealth: Payer: Self-pay | Admitting: Family Medicine

## 2024-06-16 NOTE — Telephone Encounter (Signed)
 Pt confirmed appt

## 2024-06-17 ENCOUNTER — Ambulatory Visit: Payer: Self-pay | Attending: Family Medicine | Admitting: Nurse Practitioner

## 2024-06-17 ENCOUNTER — Encounter: Payer: Self-pay | Admitting: Nurse Practitioner

## 2024-06-17 VITALS — BP 105/69 | HR 76 | Ht 63.0 in | Wt 171.0 lb

## 2024-06-17 DIAGNOSIS — Z1211 Encounter for screening for malignant neoplasm of colon: Secondary | ICD-10-CM

## 2024-06-17 DIAGNOSIS — K219 Gastro-esophageal reflux disease without esophagitis: Secondary | ICD-10-CM

## 2024-06-17 DIAGNOSIS — F32A Depression, unspecified: Secondary | ICD-10-CM

## 2024-06-17 DIAGNOSIS — J302 Other seasonal allergic rhinitis: Secondary | ICD-10-CM

## 2024-06-17 DIAGNOSIS — R5383 Other fatigue: Secondary | ICD-10-CM

## 2024-06-17 MED ORDER — CETIRIZINE HCL 10 MG PO TABS: 10.0000 mg | ORAL_TABLET | Freq: Every day | ORAL | 1 refills | Status: AC

## 2024-06-17 MED ORDER — IPRATROPIUM BROMIDE 0.03 % NA SOLN: 2.0000 | Freq: Two times a day (BID) | NASAL | 12 refills | Status: AC

## 2024-06-17 MED ORDER — PANTOPRAZOLE SODIUM 40 MG PO TBEC: 40.0000 mg | DELAYED_RELEASE_TABLET | Freq: Every day | ORAL | 0 refills | Status: AC

## 2024-06-17 NOTE — Progress Notes (Signed)
 "  Assessment & Plan:  Peggy Monroe was seen today for sore throat.  Diagnoses and all orders for this visit:  Seasonal allergies Take antihistamine and nasal spray as instructed -     cetirizine  (ZYRTEC ) 10 MG tablet; Take 1 tablet (10 mg total) by mouth daily. FOR ALLERGIES/COUGH  Fatigue due to depression -     VITAMIN D  25 Hydroxy (Vit-D Deficiency, Fractures) -     Thyroid Panel With TSH -     CBC with Differential  GERD without esophagitis STOP omeprazole  -     pantoprazole (PROTONIX) 40 MG tablet; Take 1 tablet (40 mg total) by mouth daily before breakfast. FOR ABDOMINAL PAIN  Colon cancer screening -     Ambulatory referral to Gastroenterology    Patient has been counseled on age-appropriate routine health concerns for screening and prevention. These are reviewed and up-to-date. Referrals have been placed accordingly. Immunizations are up-to-date or declined.    Subjective:   Chief Complaint  Patient presents with   Sore Throat    Patient aslo states that she has a cough. Symptoms started 1 month ago from 05/21/24.     Peggy Monroe 43 y.o. female presents to office today with complaints of sore throat and fatigue.  She is a patient of Dr Delbert  VRI was used to communicate directly with patient for the entire encounter including providing detailed patient instructions.     She has a past medical history of Asthma, HLD, Recurrent upper respiratory infection (URI), and Shortness of breath.    She has been experiencing a sore throat  and dry cough for over 1 month. She has not taken zyrtec  for the past 3 months which she was prescribed for allergies and nasal congestion. She denies fever or any other URI symptoms.  She has a history of asthma and has an inhaler for prn use.  She reports experiencing fatigue, weakness, and episodes of dizziness along with foot pain  Regarding omeprazole , she does not feel this medication is alleviating her symptoms of  stomach pain and a sensation of emptiness, which she describes as feeling like 'hunger and pain'. She finds it difficult to eat a variety of foods due to these symptoms. No difficulty swallowing her foods. She underwent a colonoscopy in 2021 and is due for repeat in April 2026.   Review of Systems  Constitutional:  Positive for malaise/fatigue.  HENT:  Positive for congestion and sore throat.   Respiratory:  Positive for cough.   Cardiovascular: Negative.   Gastrointestinal:  Positive for abdominal pain and heartburn. Negative for blood in stool, constipation, diarrhea and melena.  Musculoskeletal:  Positive for joint pain.  Neurological:  Positive for dizziness and weakness.    Past Medical History:  Diagnosis Date   Asthma    HLD (hyperlipidemia)    Recurrent upper respiratory infection (URI)    Shortness of breath     with my asthma    Past Surgical History:  Procedure Laterality Date   CESAREAN SECTION     ELBOW SURGERY Left At age 34   TONSILLECTOMY      Family History  Problem Relation Age of Onset   Colon cancer Father    Diabetes Maternal Grandmother    Diabetes Paternal Grandmother    Esophageal cancer Neg Hx    Rectal cancer Neg Hx    Stomach cancer Neg Hx     Social History Reviewed with no changes to be made today.   Outpatient Medications  Prior to Visit  Medication Sig Dispense Refill   albuterol  (VENTOLIN  HFA) 108 (90 Base) MCG/ACT inhaler INHALE 2 PUFFS INTO THE LUNGS EVERY 6 (SIX) HOURS AS NEEDED. SHORTNESS OF BREATH 6.7 g 0   omeprazole  (PRILOSEC) 20 MG capsule Take 1 capsule (20 mg total) by mouth daily before breakfast. 90 capsule 3   Vitamin D , Ergocalciferol , (DRISDOL ) 1.25 MG (50000 UNIT) CAPS capsule Take 1 capsule (50,000 Units total) by mouth every 7 (seven) days. (Patient not taking: Reported on 06/17/2024) 12 capsule 0   cetirizine  (ZYRTEC ) 10 MG tablet Take 1 tablet (10 mg total) by mouth daily. (Patient not taking: Reported on 06/17/2024) 30  tablet 11   No facility-administered medications prior to visit.    Allergies[1]     Objective:    BP 105/69 (BP Location: Left Arm, Patient Position: Sitting, Cuff Size: Normal)   Pulse 76   Ht 5' 3 (1.6 m)   Wt 171 lb (77.6 kg)   LMP 06/08/2024 (Exact Date)   SpO2 99%   BMI 30.29 kg/m  Wt Readings from Last 3 Encounters:  06/17/24 171 lb (77.6 kg)  03/05/24 174 lb 9.6 oz (79.2 kg)  11/05/23 174 lb (78.9 kg)    Physical Exam Vitals and nursing note reviewed.  Constitutional:      Appearance: She is well-developed.  HENT:     Head: Normocephalic and atraumatic.     Nose:     Right Turbinates: Enlarged, swollen and pale.     Left Turbinates: Enlarged, swollen and pale.     Mouth/Throat:     Pharynx: Postnasal drip present. No pharyngeal swelling, oropharyngeal exudate, posterior oropharyngeal erythema or uvula swelling.  Cardiovascular:     Rate and Rhythm: Normal rate and regular rhythm.     Heart sounds: Normal heart sounds. No murmur heard.    No friction rub. No gallop.  Pulmonary:     Effort: Pulmonary effort is normal. No tachypnea or respiratory distress.     Breath sounds: Normal breath sounds. No decreased breath sounds, wheezing, rhonchi or rales.  Chest:     Chest wall: No tenderness.  Abdominal:     General: Bowel sounds are normal.     Palpations: Abdomen is soft.  Musculoskeletal:        General: Normal range of motion.     Cervical back: Normal range of motion.  Skin:    General: Skin is warm and dry.  Neurological:     Mental Status: She is alert and oriented to person, place, and time.     Coordination: Coordination normal.  Psychiatric:        Behavior: Behavior normal. Behavior is cooperative.        Thought Content: Thought content normal.        Judgment: Judgment normal.          Patient has been counseled extensively about nutrition and exercise as well as the importance of adherence with medications and regular follow-up. The  patient was given clear instructions to go to ER or return to medical center if symptoms don't improve, worsen or new problems develop. The patient verbalized understanding.   Follow-up: Return for F/U with PCP if symptoms persist..   Haze LELON Servant, FNP-BC [facility] [place], [place] [phone]   06/17/2024, 5:08 PM     [1] No Known Allergies  "

## 2024-06-18 ENCOUNTER — Ambulatory Visit: Payer: Self-pay | Admitting: Nurse Practitioner

## 2024-06-18 DIAGNOSIS — E559 Vitamin D deficiency, unspecified: Secondary | ICD-10-CM

## 2024-06-18 LAB — CBC WITH DIFFERENTIAL/PLATELET
Basophils Absolute: 0.1 x10E3/uL (ref 0.0–0.2)
Basos: 1 %
EOS (ABSOLUTE): 0.1 x10E3/uL (ref 0.0–0.4)
Eos: 2 %
Hematocrit: 39.5 % (ref 34.0–46.6)
Hemoglobin: 12.5 g/dL (ref 11.1–15.9)
Immature Grans (Abs): 0 x10E3/uL (ref 0.0–0.1)
Immature Granulocytes: 0 %
Lymphocytes Absolute: 2.4 x10E3/uL (ref 0.7–3.1)
Lymphs: 32 %
MCH: 30 pg (ref 26.6–33.0)
MCHC: 31.6 g/dL (ref 31.5–35.7)
MCV: 95 fL (ref 79–97)
Monocytes Absolute: 0.6 x10E3/uL (ref 0.1–0.9)
Monocytes: 8 %
Neutrophils Absolute: 4.3 x10E3/uL (ref 1.4–7.0)
Neutrophils: 57 %
Platelets: 332 x10E3/uL (ref 150–450)
RBC: 4.17 x10E6/uL (ref 3.77–5.28)
RDW: 13.9 % (ref 11.7–15.4)
WBC: 7.4 x10E3/uL (ref 3.4–10.8)

## 2024-06-18 LAB — THYROID PANEL WITH TSH
Free Thyroxine Index: 2 (ref 1.2–4.9)
T3 Uptake Ratio: 25 % (ref 24–39)
T4, Total: 7.9 ug/dL (ref 4.5–12.0)
TSH: 0.93 u[IU]/mL (ref 0.450–4.500)

## 2024-06-18 LAB — VITAMIN D 25 HYDROXY (VIT D DEFICIENCY, FRACTURES): Vit D, 25-Hydroxy: 16.1 ng/mL — ABNORMAL LOW (ref 30.0–100.0)

## 2024-06-18 MED ORDER — VITAMIN D (ERGOCALCIFEROL) 1.25 MG (50000 UNIT) PO CAPS: 50000.0000 [IU] | ORAL_CAPSULE | ORAL | 0 refills | Status: AC

## 2024-06-30 ENCOUNTER — Ambulatory Visit: Payer: Self-pay | Admitting: Nurse Practitioner
